# Patient Record
Sex: Male | Born: 1950 | Race: White | Hispanic: No | Marital: Single | Smoking: Current every day smoker
Health system: Southern US, Community
[De-identification: ages and names within clinical notes are randomized; demographics above are authoritative.]

## PROBLEM LIST (undated history)

## (undated) DIAGNOSIS — J449 Chronic obstructive pulmonary disease, unspecified: Secondary | ICD-10-CM

## (undated) DIAGNOSIS — M5136 Other intervertebral disc degeneration, lumbar region: Secondary | ICD-10-CM

## (undated) DIAGNOSIS — I639 Cerebral infarction, unspecified: Secondary | ICD-10-CM

## (undated) DIAGNOSIS — M51369 Other intervertebral disc degeneration, lumbar region without mention of lumbar back pain or lower extremity pain: Secondary | ICD-10-CM

## (undated) HISTORY — DX: Other intervertebral disc degeneration, lumbar region: M51.36

## (undated) HISTORY — DX: Other intervertebral disc degeneration, lumbar region without mention of lumbar back pain or lower extremity pain: M51.369

## (undated) HISTORY — DX: Chronic obstructive pulmonary disease, unspecified: J44.9

## (undated) HISTORY — DX: Cerebral infarction, unspecified: I63.9

---

## 2002-09-21 ENCOUNTER — Encounter: Payer: Self-pay | Admitting: Emergency Medicine

## 2002-09-21 ENCOUNTER — Inpatient Hospital Stay (HOSPITAL_COMMUNITY): Admission: EM | Admit: 2002-09-21 | Discharge: 2002-09-22 | Payer: Self-pay | Admitting: Emergency Medicine

## 2006-09-23 ENCOUNTER — Inpatient Hospital Stay (HOSPITAL_COMMUNITY): Admission: RE | Admit: 2006-09-23 | Discharge: 2006-09-25 | Payer: Self-pay | Admitting: Neurosurgery

## 2007-09-02 HISTORY — PX: BACK SURGERY: SHX140

## 2008-12-18 IMAGING — CR DG LUMBAR SPINE 1V
1 series · 1 of 1 positions shown · non-contrast
Comparison: none

CLINICAL DATA: 55-year-old with disk herniation.  
 LUMBAR SPINE ? 1 VIEW:
 Lateral lumbar spine film from the operating room.

[view not recorded]
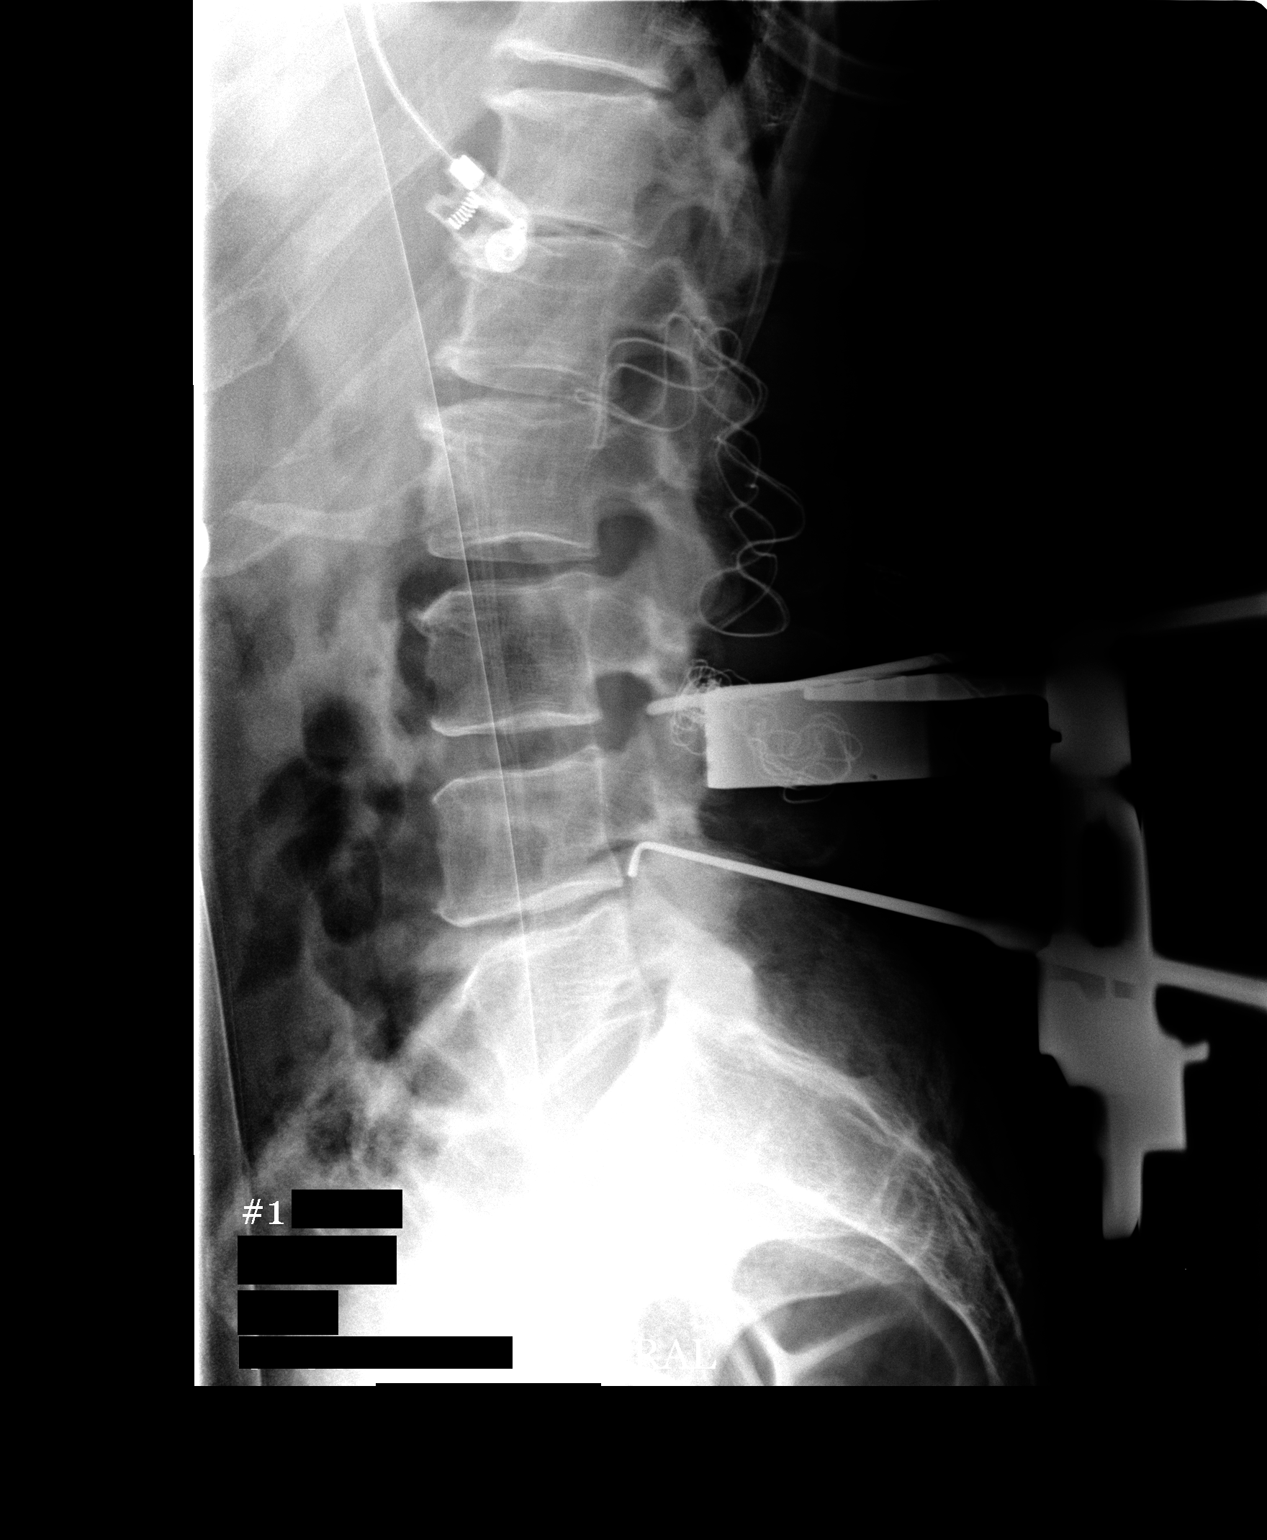

[1 of 1 positions shown; findings below may reference images not displayed]

FINDINGS: There are surgical instruments marking the L3-4 and L4-5 disk space.
IMPRESSION: L3-4 and L4-5 marked intraoperatively.

## 2010-09-22 ENCOUNTER — Encounter: Payer: Self-pay | Admitting: Orthopedic Surgery

## 2011-10-21 ENCOUNTER — Other Ambulatory Visit: Payer: Self-pay | Admitting: Orthopedic Surgery

## 2011-11-05 ENCOUNTER — Ambulatory Visit (HOSPITAL_BASED_OUTPATIENT_CLINIC_OR_DEPARTMENT_OTHER): Admission: RE | Admit: 2011-11-05 | Payer: 59 | Source: Ambulatory Visit | Admitting: Orthopedic Surgery

## 2011-11-05 ENCOUNTER — Encounter (HOSPITAL_BASED_OUTPATIENT_CLINIC_OR_DEPARTMENT_OTHER): Admission: RE | Payer: Self-pay | Source: Ambulatory Visit

## 2011-11-05 SURGERY — FASCIOTOMY, UPPER EXTREMITY
Anesthesia: Choice | Laterality: Left

## 2013-04-06 ENCOUNTER — Other Ambulatory Visit (HOSPITAL_COMMUNITY): Payer: Self-pay | Admitting: *Deleted

## 2013-04-06 DIAGNOSIS — R0602 Shortness of breath: Secondary | ICD-10-CM

## 2013-04-15 ENCOUNTER — Ambulatory Visit (HOSPITAL_COMMUNITY)
Admission: RE | Admit: 2013-04-15 | Discharge: 2013-04-15 | Disposition: A | Discharge status: Home / Self Care | Payer: 59 | Source: Ambulatory Visit | Attending: Family Medicine | Admitting: Family Medicine

## 2013-04-15 DIAGNOSIS — J988 Other specified respiratory disorders: Secondary | ICD-10-CM | POA: Insufficient documentation

## 2013-04-15 MED ORDER — ALBUTEROL SULFATE (5 MG/ML) 0.5% IN NEBU
2.5000 mg | INHALATION_SOLUTION | Freq: Once | RESPIRATORY_TRACT | Status: AC
  Administered 2013-04-15: 2.5 mg via RESPIRATORY_TRACT

## 2015-11-28 DIAGNOSIS — M25511 Pain in right shoulder: Secondary | ICD-10-CM | POA: Diagnosis not present

## 2015-11-28 DIAGNOSIS — Z1389 Encounter for screening for other disorder: Secondary | ICD-10-CM | POA: Diagnosis not present

## 2015-11-28 DIAGNOSIS — R55 Syncope and collapse: Secondary | ICD-10-CM | POA: Diagnosis not present

## 2015-11-28 DIAGNOSIS — Z6822 Body mass index (BMI) 22.0-22.9, adult: Secondary | ICD-10-CM | POA: Diagnosis not present

## 2015-11-28 DIAGNOSIS — Z720 Tobacco use: Secondary | ICD-10-CM | POA: Diagnosis not present

## 2015-11-28 DIAGNOSIS — R29898 Other symptoms and signs involving the musculoskeletal system: Secondary | ICD-10-CM | POA: Diagnosis not present

## 2015-11-28 DIAGNOSIS — Z9181 History of falling: Secondary | ICD-10-CM | POA: Diagnosis not present

## 2015-11-28 DIAGNOSIS — G4700 Insomnia, unspecified: Secondary | ICD-10-CM | POA: Diagnosis not present

## 2015-12-04 ENCOUNTER — Ambulatory Visit (INDEPENDENT_AMBULATORY_CARE_PROVIDER_SITE_OTHER): Payer: Medicare Other

## 2015-12-04 DIAGNOSIS — R55 Syncope and collapse: Secondary | ICD-10-CM | POA: Diagnosis not present

## 2015-12-05 ENCOUNTER — Telehealth: Payer: Self-pay | Admitting: *Deleted

## 2015-12-05 NOTE — Telephone Encounter (Signed)
Called and LVM again for pt per Dr Lucia GaskinsAhern request. Advised he will be seeing Dr Marjory LiesPenumalli tomorrow instead of Dr Lucia GaskinsAhern. Still at 1030am, check in 10am. Bring updated insurance card, meds. Gave [facility] phone number.

## 2015-12-05 NOTE — Telephone Encounter (Signed)
Tried calling pt. Dennis Mclean to see if he is willing to see Dr Marjory LiesPenumalli instead of Dr Lucia GaskinsAhern. Dr Lucia GaskinsAhern had some scheduling changes. Appt would be for same time just different provider. Check in 10am. Gave [facility] phone number for him to call back.

## 2015-12-06 ENCOUNTER — Ambulatory Visit (INDEPENDENT_AMBULATORY_CARE_PROVIDER_SITE_OTHER): Payer: Medicare Other | Admitting: Diagnostic Neuroimaging

## 2015-12-06 ENCOUNTER — Encounter: Payer: Self-pay | Admitting: Diagnostic Neuroimaging

## 2015-12-06 ENCOUNTER — Ambulatory Visit: Payer: Medicare Other | Admitting: Neurology

## 2015-12-06 VITALS — BP 129/66 | HR 74 | Ht 68.0 in | Wt 153.2 lb

## 2015-12-06 DIAGNOSIS — I63422 Cerebral infarction due to embolism of left anterior cerebral artery: Secondary | ICD-10-CM | POA: Diagnosis not present

## 2015-12-06 DIAGNOSIS — R292 Abnormal reflex: Secondary | ICD-10-CM

## 2015-12-06 DIAGNOSIS — R55 Syncope and collapse: Secondary | ICD-10-CM

## 2015-12-06 DIAGNOSIS — M25511 Pain in right shoulder: Secondary | ICD-10-CM

## 2015-12-06 DIAGNOSIS — R29898 Other symptoms and signs involving the musculoskeletal system: Secondary | ICD-10-CM

## 2015-12-06 DIAGNOSIS — R739 Hyperglycemia, unspecified: Secondary | ICD-10-CM | POA: Diagnosis not present

## 2015-12-06 MED ORDER — ASPIRIN EC 81 MG PO TBEC: 81.0000 mg | DELAYED_RELEASE_TABLET | Freq: Every day | ORAL | Status: DC

## 2015-12-06 NOTE — Progress Notes (Signed)
[facility]  PATIENT: Dennis Mclean DOB: Jun 10, 1951  REFERRING CLINICIAN: M Hamrick HISTORY FROM: patient  REASON FOR VISIT: new consult    HISTORICAL  CHIEF COMPLAINT:  Chief Complaint  Patient presents with  . Right shoulder pain/weakness of RLE    rm 7, New Pt, "r shoulder pain since June 2016, heard snap while unhooking trailer;  weakness RLE since Nov 2016"    HISTORY OF PRESENT ILLNESS:   65 year old right-handed male here for evaluation of syncope, right shoulder pain, right leg weakness.  June 2016 patient was on his trailer from his truck, pulled hard with his right hand, felt a popping sensation in the right shoulder and had severe pain and weakness in his right arm. Symptoms continued for several months and has slightly improved. Patient still has significant decreased range of motion in his right shoulder and weakness in his right biceps. No numbness or tingling in his right arm.  Around 07/17/2015 patient was at home sitting down, when all of a sudden he felt lightheaded, cold, rapid heart rate. He felt like something bad was happening. A few moments later he woke up on the floor and apparently had passed out. He did not seek medical attention for this event. After this event he noticed that his right leg had numbness and weakness and he was "dragging it". This is continued until the present time. He did not seek medical attention for this at the time.  Patient's daughter was visiting with him recently, found out about these events and urged him to seek medical attention. Patient went to PCP who then referred patient to me for further evaluation.  In retrospect patient has had some intermittent lightheaded events over the past few months. He had his last episode occurred in January 2017.  Patient notes some significant snoring as reported by his significant other. He also has some anxiety problems. Patient smokes less than one pack cigarettes per  day. He drinks 3 beers per day. He drinks 2 cups of coffee per day.   REVIEW OF SYSTEMS: Full 14 system review of systems performed and negative with exception of: As per history of present illness.  ALLERGIES: No Known Allergies  HOME MEDICATIONS: No outpatient prescriptions prior to visit.   No facility-administered medications prior to visit.    PAST MEDICAL HISTORY: Past Medical History  Diagnosis Date  . DDD (degenerative disc disease), lumbar     PAST SURGICAL HISTORY: Past Surgical History  Procedure Laterality Date  . Back surgery  2009    lower    FAMILY HISTORY: Family History  Problem Relation Age of Onset  . Stroke Mother   . Heart disease Father     SOCIAL HISTORY:  Social History   Social History  . Marital Status: Single    Spouse Name: N/A  . Number of Children: 3  . Years of Education: 13   Occupational History  .      retired   Social History Main Topics  . Smoking status: Current Every Day Smoker -- 0.50 packs/day  . Smokeless tobacco: Not on file     Comment: 12/06/15 < 1 PPD  . Alcohol Use: Yes     Comment: 3 beers a day  . 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 Sexual Activity: Not on file   Other Topics Concern  . Not on file   Social History Narrative   Lives alone   Caffeine-  coffee 2 cups daily     PHYSICAL EXAM  GENERAL EXAM/CONSTITUTIONAL: Vitals:  Filed Vitals:   12/06/15 1010  BP: 129/66  Pulse: 74  Height: 5\' 8"  (1.727 m)  Weight: 153 lb 3.2 oz (69.491 kg)     Body mass index is 23.3 kg/(m^2).  Visual Acuity Screening   Right eye Left eye Both eyes  Without correction: 20/30 20/30   With correction:        Patient is in no distress; well developed, nourished and groomed; neck is supple  CARDIOVASCULAR:  Examination of carotid arteries is normal; no carotid bruits  Regular rate and rhythm, no murmurs  Examination of peripheral vascular system by observation and palpation is  normal  EYES:  Ophthalmoscopic exam of optic discs and posterior segments is normal; no papilledema or hemorrhages  MUSCULOSKELETAL:  Gait, strength, tone, movements noted in Neurologic exam below  NEUROLOGIC: MENTAL STATUS:  No flowsheet data found.  awake, alert, oriented to person, place and time  recent and remote memory intact  normal attention and concentration  language fluent, comprehension intact, naming intact,   fund of knowledge appropriate  CRANIAL NERVE:   2nd - no papilledema on fundoscopic exam  2nd, 3rd, 4th, 6th - pupils equal and reactive to light, visual fields full to confrontation, extraocular muscles intact, no nystagmus  5th - facial sensation symmetric  7th - facial strength symmetric  8th - hearing intact  9th - palate elevates symmetrically, uvula midline  11th - shoulder shrug symmetric  12th - tongue protrusion midline  MOTOR:   normal bulk and tone, full strength in the LUE, LLE  RUE LIMITED ROM AT SHOULDER (PASSIVE AND ACTIVE) WITH CREPITUS IN RIGHT SHOULDER; RIGHT DELTOID 2-3; REMAINDER OF RUE 5  RLE --> HF 4, KE/KF 4, DF 4; INCREASED TONE IN RLE  SENSORY:   normal and symmetric to light touch, pinprick, temperature, vibration  COORDINATION:   finger-nose-finger, fine finger movements normal; LIMITED IN RUE DUE TO SHOULDER PAIN  HEEL SHIN ON RIGHT HAS DYSMETRIA  REFLEXES:   deep tendon reflexes --> BUE 2; RIGHT KNEE 3, RIGHT ANKLE 1; LEFT KNEE 2, LEFT ANKLE TRACE  GAIT/STATION:   RIGHT LEG SPASTIC GAIT; SCUFFS SHOE ON GROUND; SOMETIMES HIGH STEPPING    DIAGNOSTIC DATA (LABS, IMAGING, TESTING) - I reviewed patient records, labs, notes, testing and imaging myself where available.  No results found for: WBC, HGB, HCT, MCV, PLT No results found for: NA, K, CL, CO2, GLUCOSE, BUN, CREATININE, CALCIUM, PROT, ALBUMIN, AST, ALT, ALKPHOS, BILITOT, GFRNONAA, GFRAA No results found for: CHOL, HDL, LDLCALC, LDLDIRECT, TRIG,  CHOLHDL No results found for: ZHYQ6VHGBA1C No results found for: VITAMINB12 No results found for: TSH  09/24/06 XRAY LUMBAR  L3-4 and L4-5 marked intraoperatively.    ASSESSMENT AND PLAN  65 y.o. year old male here with new onset episode of syncope in November 2016, followed by right leg weakness and upper motor neuron signs since that time. Could represent syncope and stroke. Patient also has right arm weakness likely related to right shoulder injury in June 2016, which is likely a separate problem.   PROBLEM LIST:  1. Syncope and collapse   2. Right shoulder pain   3. Right leg weakness   4. Hyperreflexia of lower extremity   5. Cerebrovascular accident (CVA) due to embolism of left anterior cerebral artery (HCC)   6. Hyperglycemia      PLAN:  STROKE WORKUP (syncope and right leg weakness) - MRI, MRA head,  TTE, carotid, lipids, A1c, sleep study - start aspirin  daily - smoking cessation reviewed - limit ETOH to 1 drink per day - nutrition and exercise reviewed  SYNCOPE WORKUP - EEG for seizure workup - follow up cardiac monitor - follow up with cardiology for syncope workup  RIGHT SHOULDER PAIN - consider orthopedic consult for rotator cuff tear / shoulder injury (from June 2016)   Orders Placed This Encounter  Procedures  . MR Brain Wo Contrast  . MR MRA HEAD WO CONTRAST  . Lipid Panel  . CBC with Differential/Platelet  . Comprehensive metabolic panel  . Hemoglobin A1c  . Ambulatory referral to Sleep Studies  . ECHOCARDIOGRAM COMPLETE  . EEG adult   Meds ordered this encounter  Medications  . aspirin EC 81 MG tablet    Sig: Take 1 tablet (81 mg total) by mouth daily.   Return in about 6 weeks (around 01/17/2016).  I reviewed images, labs, notes, records myself. I summarized findings and reviewed with patient, for this high risk condition (syncope; possible stroke) requiring high complexity decision making.     Suanne Marker, MD 12/06/2015, 11:37  AM Certified in Neurology, Neurophysiology and Neuroimaging  [facility] [address] [place], [place] [address] [phone]

## 2015-12-06 NOTE — Patient Instructions (Signed)
  Thank you for coming to see [place] at [facility]. I hope we have been able to provide you high quality care today.  You may receive a patient satisfaction survey over the next few weeks. We would appreciate your feedback and comments so that we may continue to improve ourselves and the health of our patients.   STROKE WORKUP (passsing out and right leg weakness) - MRI, MRA head, ultrasound of heart, ultrasound of carotids, labs, sleep study - start aspirin 61m daily - cut down / quit smoking - cut down / quit beer intake (maximum 1 drink per day) - try to improve nutrition and exercise  SYNCOPE / PASSING OUT WORKUP - EEG for seizure workup - follow up cardiac monitor test - follow up with cardiology clinic   RIGHT SHOULDER PAIN - consider orthopedic consult for rotator cuff tear / shoulder injury (from June 2016)    ~~~~~~~~~~~~~~~~~~~~~~~~~~~~~~~~~~~~~~~~~~~~~~~~~~~~~~~~~~~~~~~~~  DR. Gurveer Colucci'S GUIDE TO HAPPY AND HEALTHY LIVING These are some of my general health and wellness recommendations. Some of them may apply to you better than others. Please use common sense as you try these suggestions and feel free to ask me any questions.   ACTIVITY/FITNESS Mental, social, emotional and physical stimulation are very important for brain and body health. Try learning a new activity (arts, music, language, sports, games).  Keep moving your body to the best of your abilities. You can do this at home, inside or outside, the park, community center, gym or anywhere you like. Consider a physical therapist or personal trainer to get started. Consider the app Sworkit. Fitness trackers such as smart-watches, smart-phones or Fitbits can help as well.   NUTRITION Eat more plants: colorful vegetables, nuts, seeds and berries.  Eat less sugar, salt, preservatives and processed foods.  Avoid toxins such as cigarettes and alcohol.  Drink water when you are thirsty. Warm water  with a slice of lemon is an excellent morning drink to start the day.  Consider these websites for more information The Nutrition Source (hhttps://www.henry-hernandez.biz/ [employer] Nutrition (wWindowBlog.ch   RELAXATION Consider practicing mindfulness meditation or other relaxation techniques such as deep breathing, prayer, yoga, tai chi, massage. See website mindful.org or the apps Headspace or Calm to help get started.   SLEEP Try to get at least 7-8+ hours sleep per day. Regular exercise and reduced caffeine will help you sleep better. Practice good sleep hygeine techniques. See website sleep.org for more information.   PLANNING Prepare estate planning, living will, healthcare POA documents. Sometimes this is best planned with the help of an attorney. Theconversationproject.org and agingwithdignity.org are excellent resources.

## 2015-12-07 ENCOUNTER — Telehealth: Payer: Self-pay | Admitting: *Deleted

## 2015-12-07 LAB — LIPID PANEL
CHOLESTEROL TOTAL: 186 mg/dL (ref 100–199)
Chol/HDL Ratio: 4.5 ratio units (ref 0.0–5.0)
HDL: 41 mg/dL (ref >39)
LDL Calculated: 108 mg/dL — ABNORMAL HIGH (ref 0–99)
TRIGLYCERIDES: 184 mg/dL — AB (ref 0–149)
VLDL Cholesterol Cal: 37 mg/dL (ref 5–40)

## 2015-12-07 LAB — CBC WITH DIFFERENTIAL/PLATELET
BASOS ABS: 0.1 10*3/uL (ref 0.0–0.2)
BASOS: 1 %
EOS (ABSOLUTE): 0.2 10*3/uL (ref 0.0–0.4)
Eos: 2 %
Hematocrit: 49.9 % (ref 37.5–51.0)
Hemoglobin: 17.3 g/dL (ref 12.6–17.7)
IMMATURE GRANS (ABS): 0 10*3/uL (ref 0.0–0.1)
Immature Granulocytes: 0 %
LYMPHS: 37 %
Lymphocytes Absolute: 3.2 10*3/uL — ABNORMAL HIGH (ref 0.7–3.1)
MCH: 33.3 pg — AB (ref 26.6–33.0)
MCHC: 34.7 g/dL (ref 31.5–35.7)
MCV: 96 fL (ref 79–97)
MONOCYTES: 10 %
Monocytes Absolute: 0.8 10*3/uL (ref 0.1–0.9)
NEUTROS ABS: 4.4 10*3/uL (ref 1.4–7.0)
Neutrophils: 50 %
Platelets: 223 10*3/uL (ref 150–379)
RBC: 5.19 x10E6/uL (ref 4.14–5.80)
RDW: 13.6 % (ref 12.3–15.4)
WBC: 8.7 10*3/uL (ref 3.4–10.8)

## 2015-12-07 LAB — HEMOGLOBIN A1C
Est. average glucose Bld gHb Est-mCnc: 103 mg/dL
Hgb A1c MFr Bld: 5.2 % (ref 4.8–5.6)

## 2015-12-07 LAB — COMPREHENSIVE METABOLIC PANEL
ALBUMIN: 4.6 g/dL (ref 3.6–4.8)
ALT: 17 IU/L (ref 0–44)
AST: 22 IU/L (ref 0–40)
Albumin/Globulin Ratio: 1.8 (ref 1.2–2.2)
Alkaline Phosphatase: 83 IU/L (ref 39–117)
BILIRUBIN TOTAL: 0.5 mg/dL (ref 0.0–1.2)
BUN / CREAT RATIO: 13 (ref 10–24)
BUN: 12 mg/dL (ref 8–27)
CO2: 25 mmol/L (ref 18–29)
CREATININE: 0.9 mg/dL (ref 0.76–1.27)
Calcium: 10.1 mg/dL (ref 8.6–10.2)
Chloride: 100 mmol/L (ref 96–106)
GFR, EST AFRICAN AMERICAN: 103 mL/min/{1.73_m2} (ref >59)
GFR, EST NON AFRICAN AMERICAN: 89 mL/min/{1.73_m2} (ref >59)
GLUCOSE: 81 mg/dL (ref 65–99)
Globulin, Total: 2.6 g/dL (ref 1.5–4.5)
Potassium: 5.8 mmol/L — ABNORMAL HIGH (ref 3.5–5.2)
Sodium: 141 mmol/L (ref 134–144)
TOTAL PROTEIN: 7.2 g/dL (ref 6.0–8.5)

## 2015-12-07 NOTE — Telephone Encounter (Signed)
Per Dr Marjory LiesPenumalli, spoke with patient and informed him his lab results are back. All results normal except elevated triglycerides and LDL which Dr Marjory LiesPenumalli recommends he change his diet, have PCP monitor. Discussed diet changes with pt. Informed him his potassium is elevated, and dr wants him to come into office in a week to repeat. Gave him lab hours of operation. He verbalized understanding, appreciation.

## 2015-12-10 ENCOUNTER — Telehealth: Payer: Self-pay | Admitting: *Deleted

## 2015-12-10 NOTE — Telephone Encounter (Signed)
Patient called back to advise, will do labs when he comes in tomorrow 12/11/15 for EEG.

## 2015-12-10 NOTE — Telephone Encounter (Signed)
Spoke with patient re: repeat lab today per Dr Marjory LiesPenumalli, unless he is feeling well. He stated he is feeling fine, no new problems, c/o. Advised he come in for repeat lab by Wed this week. He [facility] and will come in that day. He had no questions., verbalized understanding.

## 2015-12-11 ENCOUNTER — Ambulatory Visit (INDEPENDENT_AMBULATORY_CARE_PROVIDER_SITE_OTHER): Payer: Medicare Other | Admitting: Diagnostic Neuroimaging

## 2015-12-11 ENCOUNTER — Other Ambulatory Visit (INDEPENDENT_AMBULATORY_CARE_PROVIDER_SITE_OTHER): Payer: Self-pay

## 2015-12-11 ENCOUNTER — Other Ambulatory Visit: Payer: Self-pay | Admitting: *Deleted

## 2015-12-11 DIAGNOSIS — R29898 Other symptoms and signs involving the musculoskeletal system: Secondary | ICD-10-CM | POA: Diagnosis not present

## 2015-12-11 DIAGNOSIS — R292 Abnormal reflex: Secondary | ICD-10-CM

## 2015-12-11 DIAGNOSIS — R55 Syncope and collapse: Secondary | ICD-10-CM

## 2015-12-11 DIAGNOSIS — I63422 Cerebral infarction due to embolism of left anterior cerebral artery: Secondary | ICD-10-CM

## 2015-12-11 DIAGNOSIS — Z0289 Encounter for other administrative examinations: Secondary | ICD-10-CM

## 2015-12-12 ENCOUNTER — Telehealth: Payer: Self-pay | Admitting: *Deleted

## 2015-12-12 ENCOUNTER — Ambulatory Visit (HOSPITAL_COMMUNITY)
Admission: RE | Admit: 2015-12-12 | Discharge: 2015-12-12 | Disposition: A | Discharge status: Home / Self Care | Payer: Medicare Other | Source: Ambulatory Visit | Attending: Diagnostic Neuroimaging | Admitting: Diagnostic Neuroimaging

## 2015-12-12 DIAGNOSIS — R29898 Other symptoms and signs involving the musculoskeletal system: Secondary | ICD-10-CM | POA: Insufficient documentation

## 2015-12-12 DIAGNOSIS — R55 Syncope and collapse: Secondary | ICD-10-CM | POA: Diagnosis not present

## 2015-12-12 DIAGNOSIS — R292 Abnormal reflex: Secondary | ICD-10-CM | POA: Insufficient documentation

## 2015-12-12 DIAGNOSIS — I63422 Cerebral infarction due to embolism of left anterior cerebral artery: Secondary | ICD-10-CM | POA: Diagnosis not present

## 2015-12-12 LAB — COMPREHENSIVE METABOLIC PANEL
ALT: 15 IU/L (ref 0–44)
AST: 21 IU/L (ref 0–40)
Albumin/Globulin Ratio: 1.5 (ref 1.2–2.2)
Albumin: 4.3 g/dL (ref 3.6–4.8)
Alkaline Phosphatase: 83 IU/L (ref 39–117)
BUN/Creatinine Ratio: 12 (ref 10–24)
BUN: 13 mg/dL (ref 8–27)
Bilirubin Total: 0.5 mg/dL (ref 0.0–1.2)
CALCIUM: 9.4 mg/dL (ref 8.6–10.2)
CHLORIDE: 100 mmol/L (ref 96–106)
CO2: 23 mmol/L (ref 18–29)
Creatinine, Ser: 1.07 mg/dL (ref 0.76–1.27)
GFR, EST AFRICAN AMERICAN: 84 mL/min/{1.73_m2} (ref >59)
GFR, EST NON AFRICAN AMERICAN: 72 mL/min/{1.73_m2} (ref >59)
GLUCOSE: 114 mg/dL — AB (ref 65–99)
Globulin, Total: 2.8 g/dL (ref 1.5–4.5)
Potassium: 4.8 mmol/L (ref 3.5–5.2)
Sodium: 138 mmol/L (ref 134–144)
TOTAL PROTEIN: 7.1 g/dL (ref 6.0–8.5)

## 2015-12-12 NOTE — Progress Notes (Signed)
VASCULAR LAB PRELIMINARY  PRELIMINARY  PRELIMINARY  PRELIMINARY  Carotid duplex completed.    Right side: 1-39% ICA stenosis. No evidence stenosis in left ICA. Bilaterally vertebral artery - antegrade. Patient said he fell at the emergency entrance. Patient had injuried left hand  knuckles scrapes and left knee scrapes.  Jenetta Logesami Adrik Khim, RVT, RDMS 12/12/2015, 12:53 PM

## 2015-12-12 NOTE — Telephone Encounter (Signed)
LVM for patient informing him his repeat lab results are normal. Left number for any questions.

## 2015-12-14 NOTE — Procedures (Signed)
   [facility]  EEG (ELECTROENCEPHALOGRAM) REPORT   STUDY DATE: 12/11/15 PATIENT NAME: Dennis Mclean DOB: 12/23/1950 MRN: [id number]  ORDERING CLINICIAN: Joycelyn SchmidVikram Jeffrie Lofstrom, MD   TECHNOLOGIST: Gearldine ShownLorraine Jones  TECHNIQUE: Electroencephalogram was recorded utilizing standard 10-20 system of lead placement and reformatted into average and bipolar montages.  RECORDING TIME: 27 minutes  ACTIVATION: hyperventilation and photic stimulation  CLINICAL INFORMATION: 65 year old male with lightheadedness and syncope.  FINDINGS: Background rhythms of 12-14 hertz and 15-20 microvolts. No focal, lateralizing, epileptiform activity or seizures are seen. Patient recorded in the awake and drowsy state. EKG channel shows 60-70 beats per minute.  IMPRESSION:  Normal EEG in the awake state.    INTERPRETING PHYSICIAN:  Suanne MarkerVIKRAM R. Talvin Christianson, MD Certified in Neurology, Neurophysiology and Neuroimaging  [facility] [address], [address] [place], [place] [address] [phone]

## 2015-12-17 ENCOUNTER — Ambulatory Visit
Admission: RE | Admit: 2015-12-17 | Discharge: 2015-12-17 | Disposition: A | Discharge status: Home / Self Care | Payer: Medicare Other | Source: Ambulatory Visit | Attending: Diagnostic Neuroimaging | Admitting: Diagnostic Neuroimaging

## 2015-12-17 ENCOUNTER — Ambulatory Visit (INDEPENDENT_AMBULATORY_CARE_PROVIDER_SITE_OTHER): Payer: Medicare Other | Admitting: Neurology

## 2015-12-17 ENCOUNTER — Encounter: Payer: Self-pay | Admitting: Neurology

## 2015-12-17 VITALS — BP 130/82 | HR 78 | Resp 20 | Ht 68.0 in | Wt 144.0 lb

## 2015-12-17 DIAGNOSIS — F5102 Adjustment insomnia: Secondary | ICD-10-CM | POA: Diagnosis not present

## 2015-12-17 DIAGNOSIS — R55 Syncope and collapse: Secondary | ICD-10-CM

## 2015-12-17 DIAGNOSIS — R29898 Other symptoms and signs involving the musculoskeletal system: Secondary | ICD-10-CM | POA: Diagnosis not present

## 2015-12-17 DIAGNOSIS — G451 Carotid artery syndrome (hemispheric): Secondary | ICD-10-CM | POA: Diagnosis not present

## 2015-12-17 DIAGNOSIS — F17200 Nicotine dependence, unspecified, uncomplicated: Secondary | ICD-10-CM | POA: Diagnosis not present

## 2015-12-17 DIAGNOSIS — R292 Abnormal reflex: Secondary | ICD-10-CM

## 2015-12-17 DIAGNOSIS — G4726 Circadian rhythm sleep disorder, shift work type: Secondary | ICD-10-CM | POA: Diagnosis not present

## 2015-12-17 DIAGNOSIS — R0683 Snoring: Secondary | ICD-10-CM | POA: Diagnosis not present

## 2015-12-17 DIAGNOSIS — I63422 Cerebral infarction due to embolism of left anterior cerebral artery: Secondary | ICD-10-CM
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
## 2015-12-17 NOTE — Patient Instructions (Addendum)
Please remember to try to maintain good sleep hygiene, which means: Keep a regular sleep and wake schedule, try not to exercise or have a meal within 2 hours of your bedtime, try to keep your bedroom conducive for sleep, that is, cool and dark, without light distractors such as an illuminated alarm clock, and refrain from watching TV right before sleep or in the middle of the night and do not keep the TV or radio on during the night. Also, try not to use or play on electronic devices at bedtime, such as your cell phone, tablet PC or laptop. If you like to read at bedtime on an electronic device, try to dim the background light as much as possible. Do not eat in the middle of the night.   We will request a sleep study.    We will look for leg twitching and snoring or sleep apnea.   For chronic insomnia, you are best followed by a psychiatrist and/or sleep psychologist.   We will call you with the sleep study results and make a follow up appointment if needed.    Smoking Cessation, Tips for Success If you are ready to quit smoking, congratulations! You have chosen to help yourself be healthier. Cigarettes bring nicotine, tar, carbon monoxide, and other irritants into your body. Your lungs, heart, and blood vessels will be able to work better without these poisons. There are many different ways to quit smoking. Nicotine gum, nicotine patches, a nicotine inhaler, or nicotine nasal spray can help with physical craving. Hypnosis, support groups, and medicines help break the habit of smoking. WHAT THINGS CAN I DO TO MAKE QUITTING EASIER?  Here are some tips to help you quit for good:  Pick a date when you will quit smoking completely. Tell all of your friends and family about your plan to quit on that date.  Do not try to slowly cut down on the number of cigarettes you are smoking. Pick a quit date and quit smoking completely starting on that day.  Throw away all cigarettes.   Clean and remove all  ashtrays from your home, work, and car.  On a card, write down your reasons for quitting. Carry the card with you and read it when you get the urge to smoke.  Cleanse your body of nicotine. Drink enough water and fluids to keep your urine clear or pale yellow. Do this after quitting to flush the nicotine from your body.  Learn to predict your moods. Do not let a bad situation be your excuse to have a cigarette. Some situations in your life might tempt you into wanting a cigarette.  Never have "just one" cigarette. It leads to wanting another and another. Remind yourself of your decision to quit.  Change habits associated with smoking. If you smoked while driving or when feeling stressed, try other activities to replace smoking. Stand up when drinking your coffee. Brush your teeth after eating. Sit in a different chair when you read the paper. Avoid alcohol while trying to quit, and try to drink fewer caffeinated beverages. Alcohol and caffeine may urge you to smoke.  Avoid foods and drinks that can trigger a desire to smoke, such as sugary or spicy foods and alcohol.  Ask people who smoke not to smoke around you.  Have something planned to do right after eating or having a cup of coffee. For example, plan to take a walk or exercise.  Try a relaxation exercise to calm you down and decrease  your stress. Remember, you may be tense and nervous for the first 2 weeks after you quit, but this will pass.  Find new activities to keep your hands busy. Play with a pen, coin, or rubber band. Doodle or draw things on paper.  Brush your teeth right after eating. This will help cut down on the craving for the taste of tobacco after meals. You can also try mouthwash.   Use oral substitutes in place of cigarettes. Try using lemon drops, carrots, cinnamon sticks, or chewing gum. Keep them handy so they are available when you have the urge to smoke.  When you have the urge to smoke, try deep  breathing.  Designate your home as a nonsmoking area.  If you are a heavy smoker, ask your health care provider about a prescription for nicotine chewing gum. It can ease your withdrawal from nicotine.  Reward yourself. Set aside the cigarette money you save and buy yourself something nice.  Look for support from others. Join a support group or smoking cessation program. Ask someone at home or at work to help you with your plan to quit smoking.  Always ask yourself, "Do I need this cigarette or is this just a reflex?" Tell yourself, "Today, I choose not to smoke," or "I do not want to smoke." You are reminding yourself of your decision to quit.  Do not replace cigarette smoking with electronic cigarettes (commonly called e-cigarettes). The safety of e-cigarettes is unknown, and some may contain harmful chemicals.  If you relapse, do not give up! Plan ahead and think about what you will do the next time you get the urge to smoke. HOW WILL I FEEL WHEN I QUIT SMOKING? You may have symptoms of withdrawal because your body is used to nicotine (the addictive substance in cigarettes). You may crave cigarettes, be irritable, feel very hungry, cough often, get headaches, or have difficulty concentrating. The withdrawal symptoms are only temporary. They are strongest when you first quit but will go away within 10-14 days. When withdrawal symptoms occur, stay in control. Think about your reasons for quitting. Remind yourself that these are signs that your body is healing and getting used to being without cigarettes. Remember that withdrawal symptoms are easier to treat than the major diseases that smoking can cause.  Even after the withdrawal is over, expect periodic urges to smoke. However, these cravings are generally short lived and will go away whether you smoke or not. Do not smoke! WHAT RESOURCES ARE AVAILABLE TO HELP ME QUIT SMOKING? Your health care provider can direct you to community resources or  hospitals for support, which may include:  Group support.  Education.  Hypnosis.  Therapy.   This information is not intended to replace advice given to you by your health care provider. Make sure you discuss any questions you have with your health care provider.   Document Released: 05/16/2004 Document Revised: 09/08/2014 Document Reviewed: 02/03/2013 Elsevier Interactive Patient Education [employer]

## 2015-12-26 ENCOUNTER — Telehealth: Payer: Self-pay | Admitting: Diagnostic Neuroimaging

## 2015-12-26 NOTE — Telephone Encounter (Signed)
Patient called to request results of MRI, EEG, Ultrasound.

## 2015-12-28 NOTE — Telephone Encounter (Signed)
I called patient with results. Continue stroke risk factor reduction. Has echo on Monday. No clear explanation for right leg weakness at this time. Could be lumbar radiculopathy vs c or t spine cord issue. Left ICA in brain not seen (? Congenital absent vs occlusion). Bilateral ICA in neck seems ok by u/s.   PLAN: - follow up TTE on Monday - follow up sleep study - follow up in clinic with me later in May 2017.   Suanne MarkerVIKRAM R. PENUMALLI, MD 12/28/2015, 6:15 PM Certified in Neurology, Neurophysiology and Neuroimaging  [facility] [address], [address] [place], [place] [id number] [phone]

## 2015-12-31 ENCOUNTER — Ambulatory Visit (HOSPITAL_COMMUNITY): Payer: Medicare Other | Attending: Cardiovascular Disease

## 2015-12-31 ENCOUNTER — Other Ambulatory Visit: Payer: Self-pay

## 2015-12-31 DIAGNOSIS — I639 Cerebral infarction, unspecified: Secondary | ICD-10-CM | POA: Diagnosis present

## 2015-12-31 DIAGNOSIS — R292 Abnormal reflex: Secondary | ICD-10-CM | POA: Insufficient documentation

## 2015-12-31 DIAGNOSIS — Z720 Tobacco use: Secondary | ICD-10-CM | POA: Insufficient documentation

## 2015-12-31 DIAGNOSIS — R29898 Other symptoms and signs involving the musculoskeletal system: Secondary | ICD-10-CM | POA: Diagnosis not present

## 2015-12-31 DIAGNOSIS — R55 Syncope and collapse: Secondary | ICD-10-CM | POA: Diagnosis not present

## 2015-12-31 DIAGNOSIS — I63422 Cerebral infarction due to embolism of left anterior cerebral artery: Secondary | ICD-10-CM | POA: Diagnosis not present

## 2016-01-24 ENCOUNTER — Ambulatory Visit: Payer: Medicare Other | Admitting: Diagnostic Neuroimaging

## 2016-01-25 ENCOUNTER — Encounter: Payer: Self-pay | Admitting: Diagnostic Neuroimaging

## 2016-12-08 ENCOUNTER — Telehealth: Payer: Self-pay

## 2016-12-08 NOTE — Telephone Encounter (Signed)
Called patient to schedule sleep study 3 times. Have not heard back.  

## 2016-12-09 DIAGNOSIS — G2581 Restless legs syndrome: Secondary | ICD-10-CM | POA: Diagnosis not present

## 2016-12-09 DIAGNOSIS — Z6823 Body mass index (BMI) 23.0-23.9, adult: Secondary | ICD-10-CM | POA: Diagnosis not present

## 2016-12-09 DIAGNOSIS — G4700 Insomnia, unspecified: Secondary | ICD-10-CM | POA: Diagnosis not present

## 2017-03-11 DIAGNOSIS — Z1389 Encounter for screening for other disorder: Secondary | ICD-10-CM | POA: Diagnosis not present

## 2017-03-11 DIAGNOSIS — Z1211 Encounter for screening for malignant neoplasm of colon: Secondary | ICD-10-CM | POA: Diagnosis not present

## 2017-03-11 DIAGNOSIS — Z125 Encounter for screening for malignant neoplasm of prostate: Secondary | ICD-10-CM | POA: Diagnosis not present

## 2017-03-11 DIAGNOSIS — Z9181 History of falling: Secondary | ICD-10-CM | POA: Diagnosis not present

## 2017-03-11 DIAGNOSIS — Z0000 Encounter for general adult medical examination without abnormal findings: Secondary | ICD-10-CM | POA: Diagnosis not present

## 2017-03-17 DIAGNOSIS — Z125 Encounter for screening for malignant neoplasm of prostate: Secondary | ICD-10-CM | POA: Diagnosis not present

## 2017-03-17 DIAGNOSIS — Z136 Encounter for screening for cardiovascular disorders: Secondary | ICD-10-CM | POA: Diagnosis not present

## 2017-03-17 DIAGNOSIS — Z131 Encounter for screening for diabetes mellitus: Secondary | ICD-10-CM | POA: Diagnosis not present

## 2017-03-26 DIAGNOSIS — Z1211 Encounter for screening for malignant neoplasm of colon: Secondary | ICD-10-CM | POA: Diagnosis not present

## 2017-06-17 DIAGNOSIS — Z6822 Body mass index (BMI) 22.0-22.9, adult: Secondary | ICD-10-CM | POA: Diagnosis not present

## 2017-06-17 DIAGNOSIS — F410 Panic disorder [episodic paroxysmal anxiety] without agoraphobia: Secondary | ICD-10-CM | POA: Diagnosis not present

## 2017-06-17 DIAGNOSIS — G4700 Insomnia, unspecified: Secondary | ICD-10-CM | POA: Diagnosis not present

## 2018-02-16 DIAGNOSIS — Z136 Encounter for screening for cardiovascular disorders: Secondary | ICD-10-CM | POA: Diagnosis not present

## 2018-02-16 DIAGNOSIS — G4700 Insomnia, unspecified: Secondary | ICD-10-CM | POA: Diagnosis not present

## 2018-02-16 DIAGNOSIS — F410 Panic disorder [episodic paroxysmal anxiety] without agoraphobia: Secondary | ICD-10-CM | POA: Diagnosis not present

## 2018-02-16 DIAGNOSIS — Z6823 Body mass index (BMI) 23.0-23.9, adult: Secondary | ICD-10-CM | POA: Diagnosis not present

## 2018-02-16 DIAGNOSIS — G2581 Restless legs syndrome: Secondary | ICD-10-CM | POA: Diagnosis not present

## 2018-06-01 DIAGNOSIS — Z6823 Body mass index (BMI) 23.0-23.9, adult: Secondary | ICD-10-CM | POA: Diagnosis not present

## 2018-06-01 DIAGNOSIS — Z720 Tobacco use: Secondary | ICD-10-CM | POA: Diagnosis not present

## 2018-06-01 DIAGNOSIS — J440 Chronic obstructive pulmonary disease with acute lower respiratory infection: Secondary | ICD-10-CM | POA: Diagnosis not present

## 2018-06-11 DIAGNOSIS — Z720 Tobacco use: Secondary | ICD-10-CM | POA: Diagnosis not present

## 2018-06-11 DIAGNOSIS — Z6823 Body mass index (BMI) 23.0-23.9, adult: Secondary | ICD-10-CM | POA: Diagnosis not present

## 2018-06-11 DIAGNOSIS — J441 Chronic obstructive pulmonary disease with (acute) exacerbation: Secondary | ICD-10-CM | POA: Diagnosis not present

## 2018-06-11 DIAGNOSIS — Z87891 Personal history of nicotine dependence: Secondary | ICD-10-CM | POA: Diagnosis not present

## 2018-06-11 DIAGNOSIS — B370 Candidal stomatitis: Secondary | ICD-10-CM | POA: Diagnosis not present

## 2018-06-11 DIAGNOSIS — R05 Cough: Secondary | ICD-10-CM | POA: Diagnosis not present

## 2018-06-11 DIAGNOSIS — R0602 Shortness of breath: Secondary | ICD-10-CM | POA: Diagnosis not present

## 2018-07-16 ENCOUNTER — Other Ambulatory Visit: Payer: Self-pay

## 2018-08-18 DIAGNOSIS — M5441 Lumbago with sciatica, right side: Secondary | ICD-10-CM | POA: Diagnosis not present

## 2018-08-18 DIAGNOSIS — Z6824 Body mass index (BMI) 24.0-24.9, adult: Secondary | ICD-10-CM | POA: Diagnosis not present

## 2018-08-27 DIAGNOSIS — M5441 Lumbago with sciatica, right side: Secondary | ICD-10-CM | POA: Diagnosis not present

## 2018-08-27 DIAGNOSIS — Z1331 Encounter for screening for depression: Secondary | ICD-10-CM | POA: Diagnosis not present

## 2018-08-27 DIAGNOSIS — Z9181 History of falling: Secondary | ICD-10-CM | POA: Diagnosis not present

## 2018-08-27 DIAGNOSIS — I69959 Hemiplegia and hemiparesis following unspecified cerebrovascular disease affecting unspecified side: Secondary | ICD-10-CM | POA: Diagnosis not present

## 2018-08-27 DIAGNOSIS — Z6823 Body mass index (BMI) 23.0-23.9, adult: Secondary | ICD-10-CM | POA: Diagnosis not present

## 2018-09-11 DIAGNOSIS — M47817 Spondylosis without myelopathy or radiculopathy, lumbosacral region: Secondary | ICD-10-CM | POA: Diagnosis not present

## 2018-09-11 DIAGNOSIS — R29898 Other symptoms and signs involving the musculoskeletal system: Secondary | ICD-10-CM | POA: Diagnosis not present

## 2018-09-11 DIAGNOSIS — M5127 Other intervertebral disc displacement, lumbosacral region: Secondary | ICD-10-CM | POA: Diagnosis not present

## 2018-09-11 DIAGNOSIS — M4807 Spinal stenosis, lumbosacral region: Secondary | ICD-10-CM | POA: Diagnosis not present

## 2018-09-13 DIAGNOSIS — M5441 Lumbago with sciatica, right side: Secondary | ICD-10-CM | POA: Diagnosis not present

## 2018-09-13 DIAGNOSIS — I69959 Hemiplegia and hemiparesis following unspecified cerebrovascular disease affecting unspecified side: Secondary | ICD-10-CM | POA: Diagnosis not present

## 2018-09-13 DIAGNOSIS — Z79899 Other long term (current) drug therapy: Secondary | ICD-10-CM | POA: Diagnosis not present

## 2018-09-13 DIAGNOSIS — M48061 Spinal stenosis, lumbar region without neurogenic claudication: Secondary | ICD-10-CM | POA: Diagnosis not present

## 2018-09-14 DIAGNOSIS — M5126 Other intervertebral disc displacement, lumbar region: Secondary | ICD-10-CM | POA: Diagnosis not present

## 2018-09-14 DIAGNOSIS — R030 Elevated blood-pressure reading, without diagnosis of hypertension: Secondary | ICD-10-CM | POA: Diagnosis not present

## 2018-09-15 DIAGNOSIS — M48062 Spinal stenosis, lumbar region with neurogenic claudication: Secondary | ICD-10-CM | POA: Diagnosis not present

## 2018-09-15 DIAGNOSIS — M5126 Other intervertebral disc displacement, lumbar region: Secondary | ICD-10-CM | POA: Diagnosis not present

## 2018-12-13 DIAGNOSIS — K5900 Constipation, unspecified: Secondary | ICD-10-CM | POA: Diagnosis not present

## 2018-12-13 DIAGNOSIS — Z6823 Body mass index (BMI) 23.0-23.9, adult: Secondary | ICD-10-CM | POA: Diagnosis not present

## 2018-12-13 DIAGNOSIS — R339 Retention of urine, unspecified: Secondary | ICD-10-CM | POA: Diagnosis not present

## 2018-12-21 DIAGNOSIS — N200 Calculus of kidney: Secondary | ICD-10-CM | POA: Diagnosis not present

## 2018-12-21 DIAGNOSIS — F17210 Nicotine dependence, cigarettes, uncomplicated: Secondary | ICD-10-CM | POA: Diagnosis not present

## 2018-12-21 DIAGNOSIS — R1032 Left lower quadrant pain: Secondary | ICD-10-CM | POA: Diagnosis not present

## 2018-12-21 DIAGNOSIS — N3001 Acute cystitis with hematuria: Secondary | ICD-10-CM | POA: Diagnosis not present

## 2018-12-21 DIAGNOSIS — B9689 Other specified bacterial agents as the cause of diseases classified elsewhere: Secondary | ICD-10-CM | POA: Diagnosis not present

## 2018-12-21 DIAGNOSIS — Z6823 Body mass index (BMI) 23.0-23.9, adult: Secondary | ICD-10-CM | POA: Diagnosis not present

## 2019-01-13 DIAGNOSIS — Z125 Encounter for screening for malignant neoplasm of prostate: Secondary | ICD-10-CM | POA: Diagnosis not present

## 2019-01-13 DIAGNOSIS — Z9181 History of falling: Secondary | ICD-10-CM | POA: Diagnosis not present

## 2019-01-13 DIAGNOSIS — Z0000 Encounter for general adult medical examination without abnormal findings: Secondary | ICD-10-CM | POA: Diagnosis not present

## 2019-01-13 DIAGNOSIS — Z1331 Encounter for screening for depression: Secondary | ICD-10-CM | POA: Diagnosis not present

## 2019-07-12 DIAGNOSIS — I69959 Hemiplegia and hemiparesis following unspecified cerebrovascular disease affecting unspecified side: Secondary | ICD-10-CM | POA: Diagnosis not present

## 2019-07-12 DIAGNOSIS — G2581 Restless legs syndrome: Secondary | ICD-10-CM | POA: Diagnosis not present

## 2019-07-12 DIAGNOSIS — Z139 Encounter for screening, unspecified: Secondary | ICD-10-CM | POA: Diagnosis not present

## 2019-07-12 DIAGNOSIS — Z136 Encounter for screening for cardiovascular disorders: Secondary | ICD-10-CM | POA: Diagnosis not present

## 2019-07-12 DIAGNOSIS — J449 Chronic obstructive pulmonary disease, unspecified: Secondary | ICD-10-CM | POA: Diagnosis not present

## 2019-07-12 DIAGNOSIS — R29898 Other symptoms and signs involving the musculoskeletal system: Secondary | ICD-10-CM | POA: Diagnosis not present

## 2019-07-12 DIAGNOSIS — G4700 Insomnia, unspecified: Secondary | ICD-10-CM | POA: Diagnosis not present

## 2020-01-25 DIAGNOSIS — G2581 Restless legs syndrome: Secondary | ICD-10-CM | POA: Diagnosis not present

## 2020-01-25 DIAGNOSIS — R29898 Other symptoms and signs involving the musculoskeletal system: Secondary | ICD-10-CM | POA: Diagnosis not present

## 2020-01-25 DIAGNOSIS — I69959 Hemiplegia and hemiparesis following unspecified cerebrovascular disease affecting unspecified side: Secondary | ICD-10-CM | POA: Diagnosis not present

## 2020-01-25 DIAGNOSIS — G4700 Insomnia, unspecified: Secondary | ICD-10-CM | POA: Diagnosis not present

## 2020-01-25 DIAGNOSIS — J449 Chronic obstructive pulmonary disease, unspecified: Secondary | ICD-10-CM | POA: Diagnosis not present

## 2020-07-25 DIAGNOSIS — Z20822 Contact with and (suspected) exposure to covid-19: Secondary | ICD-10-CM | POA: Diagnosis not present

## 2020-08-27 DIAGNOSIS — Z125 Encounter for screening for malignant neoplasm of prostate: Secondary | ICD-10-CM | POA: Diagnosis not present

## 2020-08-27 DIAGNOSIS — Z9181 History of falling: Secondary | ICD-10-CM | POA: Diagnosis not present

## 2020-08-27 DIAGNOSIS — R29898 Other symptoms and signs involving the musculoskeletal system: Secondary | ICD-10-CM | POA: Diagnosis not present

## 2020-08-27 DIAGNOSIS — G2581 Restless legs syndrome: Secondary | ICD-10-CM | POA: Diagnosis not present

## 2020-08-27 DIAGNOSIS — I69959 Hemiplegia and hemiparesis following unspecified cerebrovascular disease affecting unspecified side: Secondary | ICD-10-CM | POA: Diagnosis not present

## 2020-08-27 DIAGNOSIS — J449 Chronic obstructive pulmonary disease, unspecified: Secondary | ICD-10-CM | POA: Diagnosis not present

## 2020-08-27 DIAGNOSIS — N529 Male erectile dysfunction, unspecified: Secondary | ICD-10-CM | POA: Diagnosis not present

## 2020-08-27 DIAGNOSIS — F339 Major depressive disorder, recurrent, unspecified: Secondary | ICD-10-CM | POA: Diagnosis not present

## 2020-08-27 DIAGNOSIS — G4700 Insomnia, unspecified: Secondary | ICD-10-CM | POA: Diagnosis not present

## 2020-08-27 DIAGNOSIS — F430 Acute stress reaction: Secondary | ICD-10-CM | POA: Diagnosis not present

## 2020-08-27 DIAGNOSIS — F410 Panic disorder [episodic paroxysmal anxiety] without agoraphobia: Secondary | ICD-10-CM | POA: Diagnosis not present

## 2020-08-27 DIAGNOSIS — D582 Other hemoglobinopathies: Secondary | ICD-10-CM | POA: Diagnosis not present

## 2020-12-10 DIAGNOSIS — Z6821 Body mass index (BMI) 21.0-21.9, adult: Secondary | ICD-10-CM | POA: Diagnosis not present

## 2020-12-10 DIAGNOSIS — I739 Peripheral vascular disease, unspecified: Secondary | ICD-10-CM | POA: Diagnosis not present

## 2020-12-10 DIAGNOSIS — R1031 Right lower quadrant pain: Secondary | ICD-10-CM | POA: Diagnosis not present

## 2020-12-10 DIAGNOSIS — M47816 Spondylosis without myelopathy or radiculopathy, lumbar region: Secondary | ICD-10-CM | POA: Diagnosis not present

## 2020-12-10 DIAGNOSIS — R1013 Epigastric pain: Secondary | ICD-10-CM | POA: Diagnosis not present

## 2020-12-10 DIAGNOSIS — R102 Pelvic and perineal pain: Secondary | ICD-10-CM | POA: Diagnosis not present

## 2020-12-10 DIAGNOSIS — M47817 Spondylosis without myelopathy or radiculopathy, lumbosacral region: Secondary | ICD-10-CM | POA: Diagnosis not present

## 2020-12-11 DIAGNOSIS — R1031 Right lower quadrant pain: Secondary | ICD-10-CM | POA: Diagnosis not present

## 2020-12-26 ENCOUNTER — Encounter: Payer: Self-pay | Admitting: Vascular Surgery

## 2020-12-26 ENCOUNTER — Other Ambulatory Visit: Payer: Self-pay

## 2020-12-26 ENCOUNTER — Ambulatory Visit (INDEPENDENT_AMBULATORY_CARE_PROVIDER_SITE_OTHER): Payer: Medicare Other | Admitting: Vascular Surgery

## 2020-12-26 VITALS — BP 138/76 | HR 70 | Temp 97.6°F | Resp 20 | Ht 68.0 in | Wt 143.0 lb

## 2020-12-26 DIAGNOSIS — F17210 Nicotine dependence, cigarettes, uncomplicated: Secondary | ICD-10-CM | POA: Diagnosis not present

## 2020-12-26 DIAGNOSIS — I739 Peripheral vascular disease, unspecified: Secondary | ICD-10-CM | POA: Diagnosis not present

## 2020-12-26 NOTE — Progress Notes (Signed)
REASON FOR CONSULT:    Superficial femoral artery occlusion.  The consult is requested by Heide Guile, FNP.  ASSESSMENT & PLAN:   BILATERAL INFRAINGUINAL ARTERIAL OCCLUSIVE DISEASE: Based on his CT scan and my exam this patient has evidence of bilateral infrainguinal arterial occlusive disease.  However he is essentially asymptomatic.  He does not describe any claudication, rest pain, or nonhealing ulcers.  He is fairly active despite his previous stroke and I suspect he is developed extensive collaterals.  Given that he is asymptomatic I would not recommend an aggressive approach to the finding on his CT of the pelvis which shows bilateral superficial femoral artery occlusions.  We have discussed the importance of tobacco cessation (3 min).  I have encouraged him to stay as active as possible.  We also discussed the importance of nutrition.  I favor a plant-based diet.  I have ordered follow-up ABIs in 1 year I will see him back at that time.  He knows to call sooner if he has problems.  If he developed disabling claudication, rest pain, or nonhealing ulcer and certainly we could consider arteriography.  I will see him back in 1 year.  I have encouraged him to take the 81 mg of aspirin daily.  I would also consider starting him on a low-dose statin but will leave that to his primary care physician.  Waverly Ferrari, MD Office: [phone]   HPI:   Dennis Mclean is a pleasant 70 y.o. male, who had developed right groin pain about 3 weeks ago.  This developed suddenly.  It was getting somewhat better but then a week ago he threw a box and it got significantly worse again.  This prompted a CT of the pelvis and an incidental finding was bilateral superficial femoral artery occlusions.  He was sent for vascular consultation.  On my history, I do not get any history of claudication, rest pain, or nonhealing ulcers.  He did have a stroke in 2016 associated with right leg weakness.  Despite this  however he remains fairly active and walks quite a bit.  His risk factors for peripheral vascular disease include a family history of premature cardiovascular disease and tobacco use.  His father had heart disease in his mid 30s.  He smokes a pack per day of cigarettes and has been smoking for as long as he can remember.  He does take 81 mg of aspirin but not consistently.  Past Medical History:  Diagnosis Date  . COPD (chronic obstructive pulmonary disease) (HCC)   . DDD (degenerative disc disease), lumbar   . Stroke [facility])     Family History  Problem Relation Age of Onset  . Stroke Mother   . Heart disease Father     SOCIAL HISTORY: Social History   Socioeconomic History  . Marital status: Single    Spouse name: Not on file  . Number of children: 3  . Years of education: 33  . Highest education level: Not on file  Occupational History    Comment: retired  Tobacco Use  . Smoking status: Current Every Day Smoker    Packs/day: 1.00  . Smokeless tobacco: Never Used  . Tobacco comment: 12/06/15 < 1 PPD  Vaping Use  . Vaping Use: Never used  Substance and Sexual Activity  . Alcohol use: Yes    Comment: 3 beers a day  . Drug use: No    Comment: "many yrs ago as teenger"  . Sexual activity: Not on file  Other Topics Concern  . Not on file  Social History Narrative   Lives alone   Caffeine- coffee 2 cups daily   Social Determinants of Health   Financial Resource Strain: Not on file  Food Insecurity: Not on file  Transportation Needs: Not on file  Physical Activity: Not on file  Stress: Not on file  Social Connections: Not on file  Intimate Partner Violence: Not on file    No Known Allergies  Current Outpatient Medications  Medication Sig Dispense Refill  . albuterol (PROVENTIL) (2.5 MG/3ML) 0.083% nebulizer solution SMARTSIG:1 Vial(s) Via Nebulizer Every 4-6 Hours PRN    . albuterol (VENTOLIN HFA) 108 (90 Base) MCG/ACT inhaler SMARTSIG:2 Puff(s) By Mouth Every  4-6 Hours PRN    . ANORO ELLIPTA 62.5-25 MCG/INH AEPB INHALE 1 PUFF DAILY    . escitalopram (LEXAPRO) 20 MG tablet Take 1 tablet by mouth daily.    . pramipexole (MIRAPEX) 1 MG tablet Take 1 mg by mouth at bedtime.    Marland Kitchen zolpidem (AMBIEN) 10 MG tablet Take 10 mg by mouth at bedtime as needed. for sleep  5   No current facility-administered medications for this visit.    REVIEW OF SYSTEMS:  [X]  denotes positive finding, [ ]  denotes negative finding Cardiac  Comments:  Chest pain or chest pressure:    Shortness of breath upon exertion:    Short of breath when lying flat:    Irregular heart rhythm:        Vascular    Pain in calf, thigh, or hip brought on by ambulation:    Pain in feet at night that wakes you up from your sleep:     Blood clot in your veins:    Leg swelling:         Pulmonary    Oxygen at home:    Productive cough:     Wheezing:         Neurologic    Sudden weakness in arms or legs:     Sudden numbness in arms or legs:     Sudden onset of difficulty speaking or slurred speech:    Temporary loss of vision in one eye:     Problems with dizziness:         Gastrointestinal    Blood in stool:     Vomited blood:         Genitourinary    Burning when urinating:     Blood in urine:        Psychiatric    Major depression:         Hematologic    Bleeding problems:    Problems with blood clotting too easily:        Skin    Rashes or ulcers:        Constitutional    Fever or chills:     PHYSICAL EXAM:   Vitals:   12/26/20 1036  BP: 138/76  Pulse: 70  Resp: 20  Temp: 97.6 F (36.4 C)  SpO2: 97%  Weight: 143 lb (64.9 kg)  Height: 5\' 8"  (1.727 m)    GENERAL: The patient is a well-nourished male, in no acute distress. The vital signs are documented above. CARDIAC: There is a regular rate and rhythm.  VASCULAR: I do not detect carotid bruits. He has palpable femoral pulses. I cannot palpate popliteal or pedal pulses. On the right side he has a  monophasic posterior tibial and peroneal signal with a Doppler.  I cannot obtain  a dorsalis pedis or anterior tibial signal. On the left side he has a monophasic posterior tibial signal.  I cannot obtain an anterior tibial or peroneal signal. He has no significant lower extremity swelling. PULMONARY: There is good air exchange bilaterally without wheezing or rales. ABDOMEN: Soft and non-tender with normal pitched bowel sounds.  MUSCULOSKELETAL: There are no major deformities or cyanosis. NEUROLOGIC: No focal weakness or paresthesias are detected. SKIN: There are no ulcers or rashes noted. PSYCHIATRIC: The patient has a normal affect.  DATA:    CT PELVIS: I did review his CT of the pelvis.  On my review of the films it does look like he has bilateral superficial femoral artery occlusions.  This was a study of the pelvis only so there is limited visualization of the arteries and only the specific area was examined.

## 2021-01-24 DIAGNOSIS — Z23 Encounter for immunization: Secondary | ICD-10-CM | POA: Diagnosis not present

## 2021-02-21 DIAGNOSIS — Z23 Encounter for immunization: Secondary | ICD-10-CM | POA: Diagnosis not present

## 2021-03-29 DIAGNOSIS — F430 Acute stress reaction: Secondary | ICD-10-CM | POA: Diagnosis not present

## 2021-03-29 DIAGNOSIS — Z125 Encounter for screening for malignant neoplasm of prostate: Secondary | ICD-10-CM | POA: Diagnosis not present

## 2021-03-29 DIAGNOSIS — I69959 Hemiplegia and hemiparesis following unspecified cerebrovascular disease affecting unspecified side: Secondary | ICD-10-CM | POA: Diagnosis not present

## 2021-03-29 DIAGNOSIS — D582 Other hemoglobinopathies: Secondary | ICD-10-CM | POA: Diagnosis not present

## 2021-03-29 DIAGNOSIS — F410 Panic disorder [episodic paroxysmal anxiety] without agoraphobia: Secondary | ICD-10-CM | POA: Diagnosis not present

## 2021-03-29 DIAGNOSIS — G4700 Insomnia, unspecified: Secondary | ICD-10-CM | POA: Diagnosis not present

## 2021-03-29 DIAGNOSIS — Z9181 History of falling: Secondary | ICD-10-CM | POA: Diagnosis not present

## 2021-03-29 DIAGNOSIS — R29898 Other symptoms and signs involving the musculoskeletal system: Secondary | ICD-10-CM | POA: Diagnosis not present

## 2021-03-29 DIAGNOSIS — Z720 Tobacco use: Secondary | ICD-10-CM | POA: Diagnosis not present

## 2021-03-29 DIAGNOSIS — J449 Chronic obstructive pulmonary disease, unspecified: Secondary | ICD-10-CM | POA: Diagnosis not present

## 2021-03-29 DIAGNOSIS — I70209 Unspecified atherosclerosis of native arteries of extremities, unspecified extremity: Secondary | ICD-10-CM | POA: Diagnosis not present

## 2021-03-29 DIAGNOSIS — G2581 Restless legs syndrome: Secondary | ICD-10-CM | POA: Diagnosis not present

## 2021-03-29 DIAGNOSIS — F339 Major depressive disorder, recurrent, unspecified: Secondary | ICD-10-CM | POA: Diagnosis not present

## 2021-09-06 DIAGNOSIS — H43821 Vitreomacular adhesion, right eye: Secondary | ICD-10-CM | POA: Diagnosis not present

## 2021-09-06 DIAGNOSIS — H25813 Combined forms of age-related cataract, bilateral: Secondary | ICD-10-CM | POA: Diagnosis not present

## 2021-09-16 DIAGNOSIS — Z9181 History of falling: Secondary | ICD-10-CM | POA: Diagnosis not present

## 2021-09-16 DIAGNOSIS — Z125 Encounter for screening for malignant neoplasm of prostate: Secondary | ICD-10-CM | POA: Diagnosis not present

## 2021-09-16 DIAGNOSIS — Z0000 Encounter for general adult medical examination without abnormal findings: Secondary | ICD-10-CM | POA: Diagnosis not present

## 2021-09-16 DIAGNOSIS — Z1331 Encounter for screening for depression: Secondary | ICD-10-CM | POA: Diagnosis not present

## 2021-09-30 DIAGNOSIS — I739 Peripheral vascular disease, unspecified: Secondary | ICD-10-CM | POA: Diagnosis not present

## 2021-09-30 DIAGNOSIS — I69959 Hemiplegia and hemiparesis following unspecified cerebrovascular disease affecting unspecified side: Secondary | ICD-10-CM | POA: Diagnosis not present

## 2021-09-30 DIAGNOSIS — Z87891 Personal history of nicotine dependence: Secondary | ICD-10-CM | POA: Diagnosis not present

## 2021-09-30 DIAGNOSIS — J449 Chronic obstructive pulmonary disease, unspecified: Secondary | ICD-10-CM | POA: Diagnosis not present

## 2021-09-30 DIAGNOSIS — E7800 Pure hypercholesterolemia, unspecified: Secondary | ICD-10-CM | POA: Diagnosis not present

## 2021-09-30 DIAGNOSIS — F410 Panic disorder [episodic paroxysmal anxiety] without agoraphobia: Secondary | ICD-10-CM | POA: Diagnosis not present

## 2021-09-30 DIAGNOSIS — R7989 Other specified abnormal findings of blood chemistry: Secondary | ICD-10-CM | POA: Diagnosis not present

## 2021-09-30 DIAGNOSIS — I700 Atherosclerosis of aorta: Secondary | ICD-10-CM | POA: Diagnosis not present

## 2021-09-30 DIAGNOSIS — F430 Acute stress reaction: Secondary | ICD-10-CM | POA: Diagnosis not present

## 2021-10-02 DIAGNOSIS — H43821 Vitreomacular adhesion, right eye: Secondary | ICD-10-CM | POA: Diagnosis not present

## 2021-10-02 DIAGNOSIS — H2513 Age-related nuclear cataract, bilateral: Secondary | ICD-10-CM | POA: Diagnosis not present

## 2021-10-02 DIAGNOSIS — H353221 Exudative age-related macular degeneration, left eye, with active choroidal neovascularization: Secondary | ICD-10-CM | POA: Diagnosis not present

## 2021-10-02 DIAGNOSIS — H353112 Nonexudative age-related macular degeneration, right eye, intermediate dry stage: Secondary | ICD-10-CM | POA: Diagnosis not present

## 2021-10-08 DIAGNOSIS — I700 Atherosclerosis of aorta: Secondary | ICD-10-CM | POA: Diagnosis not present

## 2021-10-08 DIAGNOSIS — Z122 Encounter for screening for malignant neoplasm of respiratory organs: Secondary | ICD-10-CM | POA: Diagnosis not present

## 2021-10-08 DIAGNOSIS — F17210 Nicotine dependence, cigarettes, uncomplicated: Secondary | ICD-10-CM | POA: Diagnosis not present

## 2021-10-08 DIAGNOSIS — R918 Other nonspecific abnormal finding of lung field: Secondary | ICD-10-CM | POA: Diagnosis not present

## 2021-10-08 DIAGNOSIS — J432 Centrilobular emphysema: Secondary | ICD-10-CM | POA: Diagnosis not present

## 2021-10-15 DIAGNOSIS — K6289 Other specified diseases of anus and rectum: Secondary | ICD-10-CM | POA: Diagnosis not present

## 2021-10-15 DIAGNOSIS — Z5321 Procedure and treatment not carried out due to patient leaving prior to being seen by health care provider: Secondary | ICD-10-CM | POA: Diagnosis not present

## 2021-10-16 DIAGNOSIS — K6289 Other specified diseases of anus and rectum: Secondary | ICD-10-CM | POA: Diagnosis not present

## 2021-10-16 DIAGNOSIS — N3289 Other specified disorders of bladder: Secondary | ICD-10-CM | POA: Diagnosis not present

## 2021-10-16 DIAGNOSIS — M47816 Spondylosis without myelopathy or radiculopathy, lumbar region: Secondary | ICD-10-CM | POA: Diagnosis not present

## 2021-10-16 DIAGNOSIS — R102 Pelvic and perineal pain: Secondary | ICD-10-CM | POA: Diagnosis not present

## 2021-10-18 DIAGNOSIS — H2513 Age-related nuclear cataract, bilateral: Secondary | ICD-10-CM | POA: Diagnosis not present

## 2021-10-18 DIAGNOSIS — Z01818 Encounter for other preprocedural examination: Secondary | ICD-10-CM | POA: Diagnosis not present

## 2021-10-18 DIAGNOSIS — H2511 Age-related nuclear cataract, right eye: Secondary | ICD-10-CM | POA: Diagnosis not present

## 2021-10-18 DIAGNOSIS — H2512 Age-related nuclear cataract, left eye: Secondary | ICD-10-CM | POA: Diagnosis not present

## 2021-10-30 DIAGNOSIS — H2512 Age-related nuclear cataract, left eye: Secondary | ICD-10-CM | POA: Diagnosis not present

## 2021-10-31 DIAGNOSIS — H353221 Exudative age-related macular degeneration, left eye, with active choroidal neovascularization: Secondary | ICD-10-CM | POA: Diagnosis not present

## 2021-11-13 DIAGNOSIS — H2511 Age-related nuclear cataract, right eye: Secondary | ICD-10-CM | POA: Diagnosis not present

## 2021-12-05 DIAGNOSIS — Z0100 Encounter for examination of eyes and vision without abnormal findings: Secondary | ICD-10-CM | POA: Diagnosis not present

## 2021-12-23 ENCOUNTER — Other Ambulatory Visit: Payer: Self-pay

## 2021-12-23 DIAGNOSIS — I739 Peripheral vascular disease, unspecified: Secondary | ICD-10-CM

## 2021-12-23 NOTE — Progress Notes (Signed)
Error

## 2022-01-02 ENCOUNTER — Ambulatory Visit (HOSPITAL_COMMUNITY)
Admission: RE | Admit: 2022-01-02 | Discharge: 2022-01-02 | Disposition: A | Discharge status: Home / Self Care | Payer: Medicare HMO | Source: Ambulatory Visit | Attending: Vascular Surgery | Admitting: Vascular Surgery

## 2022-01-02 ENCOUNTER — Ambulatory Visit: Payer: Medicare HMO | Admitting: Vascular Surgery

## 2022-01-02 ENCOUNTER — Encounter: Payer: Self-pay | Admitting: Vascular Surgery

## 2022-01-02 VITALS — BP 189/84 | HR 70 | Temp 98.4°F | Resp 20 | Ht 68.0 in | Wt 141.0 lb

## 2022-01-02 DIAGNOSIS — I739 Peripheral vascular disease, unspecified: Secondary | ICD-10-CM | POA: Insufficient documentation

## 2022-01-02 NOTE — Progress Notes (Signed)
? ? ?REASON FOR VISIT:  ? ?Follow-up of peripheral arterial disease ? ?MEDICAL ISSUES:  ? ?PERIPHERAL ARTERIAL DISEASE: Based on his exam he has evidence of infrainguinal arterial occlusive disease bilaterally.  His symptoms are more significant on the right side.  We have again discussed the importance of tobacco cessation.  He smokes about a pack per day.  I have encouraged him to continue walking is much as possible to promote development of collaterals.  He was just recently started on a statin by his primary care physician which is excellent.  I have again asked him to begin taking 81 mg of aspirin daily.  He is very concerned about his skin being too thin so as a compromise I suggested maybe taking this 3 times a week.  Currently his claudication is stable.  If his claudication progresses and becomes disabling or he develops rest pain or nonhealing ulcer that I think we need to proceed with arteriography. ? ?HISTORY OF MILD CAROTID DISEASE: He does have a history of some mild carotid disease and has not had a study since 2017.  For this reason when he comes back in 6 months we will get a carotid duplex scan.  He has had a stroke in the past but none recently.  He denies any recent focal weakness or paresthesias. ? ? ?HPI:  ? ?Dennis Mclean is a pleasant 71 y.o. male who I saw in consultation with peripheral arterial disease on 12/26/2020.  Based on his CT scan and my exam the patient had evidence of infrainguinal arterial occlusive disease which was more significant on the right side.  However he was essentially asymptomatic.  We discussed the importance of tobacco cessation.  I encouraged him to stay as active as possible.  We also discussed the importance of nutrition.  I set up a follow-up visit in 1 year.  I encouraged him to begin taking aspirin daily and consider taking a low-dose statin after discussing this with his primary care physician. ? ?Since I saw him last, he continues to have claudication in  both legs but more significantly on the right side.  His driveway is about a 50 feet long and he can walk almost to the end of the driveway before experiencing symptoms.  He denies any history of rest pain or nonhealing ulcers.  He does continue to smoke 1 pack/day of cigarettes.  He tells me that his primary care physician just recently started him on a statin. ? ?He does have a remote history of a stroke but is had no recent history of stroke, TIAs, expressive or receptive aphasia, or amaurosis fugax. ? ?Past Medical History:  ?Diagnosis Date  ? COPD (chronic obstructive pulmonary disease) (HCC)   ? DDD (degenerative disc disease), lumbar   ? Stroke [facility](HCC)   ? ? ?Family History  ?Problem Relation Age of Onset  ? Stroke Mother   ? Heart disease Father   ? ? ?SOCIAL HISTORY: ?Social History  ? ?Tobacco Use  ? Smoking status: Every Day  ?  Packs/day: 1.00  ?  Types: Cigarettes  ? Smokeless tobacco: Never  ? Tobacco comments:  ?  12/06/15 < 1 PPD  ?Substance Use Topics  ? Alcohol use: Yes  ?  Comment: 3 beers a day  ? ? ?No Known Allergies ? ?Current Outpatient Medications  ?Medication Sig Dispense Refill  ? albuterol (PROVENTIL) (2.5 MG/3ML) 0.083% nebulizer solution SMARTSIG:1 Vial(s) Via Nebulizer Every 4-6 Hours PRN    ? albuterol (VENTOLIN  HFA) 108 (90 Base) MCG/ACT inhaler SMARTSIG:2 Puff(s) By Mouth Every 4-6 Hours PRN    ? ANORO ELLIPTA 62.5-25 MCG/INH AEPB INHALE 1 PUFF DAILY    ? escitalopram (LEXAPRO) 20 MG tablet Take 1 tablet by mouth daily.    ? pramipexole (MIRAPEX) 1 MG tablet Take 1 mg by mouth at bedtime.    ? zolpidem (AMBIEN) 10 MG tablet Take 10 mg by mouth at bedtime as needed. for sleep  5  ? ?No current facility-administered medications for this visit.  ? ? ?REVIEW OF SYSTEMS:  ?[X]  denotes positive finding, [ ]  denotes negative finding ?Cardiac  Comments:  ?Chest pain or chest pressure:    ?Shortness of breath upon exertion:    ?Short of breath when lying flat:    ?Irregular heart rhythm:    ?     ?Vascular    ?Pain in calf, thigh, or hip brought on by ambulation: x   ?Pain in feet at night that wakes you up from your sleep:     ?Blood clot in your veins:    ?Leg swelling:     ?    ?Pulmonary    ?Oxygen at home:    ?Productive cough:     ?Wheezing:     ?    ?Neurologic    ?Sudden weakness in arms or legs:     ?Sudden numbness in arms or legs:     ?Sudden onset of difficulty speaking or slurred speech:    ?Temporary loss of vision in one eye:     ?Problems with dizziness:     ?    ?Gastrointestinal    ?Blood in stool:     ?Vomited blood:     ?    ?Genitourinary    ?Burning when urinating:     ?Blood in urine:    ?    ?Psychiatric    ?Major depression:     ?    ?Hematologic    ?Bleeding problems:    ?Problems with blood clotting too easily:    ?    ?Skin    ?Rashes or ulcers:    ?    ?Constitutional    ?Fever or chills:    ? ?PHYSICAL EXAM:  ? ?Vitals:  ? 01/02/22 0834  ?BP: (!) 189/84  ?Pulse: 70  ?Resp: 20  ?Temp: 98.4 ?F (36.9 ?C)  ?SpO2: 95%  ?Weight: 141 lb (64 kg)  ?Height: 5\' 8"  (1.727 m)  ? ? ?GENERAL: The patient is a well-nourished male, in no acute distress. The vital signs are documented above. ?CARDIAC: There is a regular rate and rhythm.  ?VASCULAR: I do not detect carotid bruits. ?He has palpable femoral pulses.  I cannot palpate popliteal or pedal pulses. ?He has no significant lower extremity swelling. ?PULMONARY: There is good air exchange bilaterally without wheezing or rales. ?ABDOMEN: Soft and non-tender with normal pitched bowel sounds.  I do not palpate an aneurysm. ?MUSCULOSKELETAL: There are no major deformities or cyanosis. ?NEUROLOGIC: No focal weakness or paresthesias are detected. ?SKIN: There are no ulcers or rashes noted. ?PSYCHIATRIC: The patient has a normal affect. ? ?DATA:   ? ?ARTERIAL DOPPLER STUDY: I have independently interpreted his arterial Doppler study today. ? ?On the right side there is a monophasic dorsalis pedis and posterior tibial signal.  ABI is 61%.  Toe  pressures 83 mmHg. ? ?On the left side there is a biphasic dorsalis pedis and posterior tibial signal.  ABI is 73%.  Toe pressures 123  mmHg. ? ?I did find a carotid duplex scan that was done back in 2017 that was fairly unremarkable with no significant carotid disease on either side. ? ?Waverly Ferrari ?[facility] ?Office [phone] ?

## 2022-01-06 DIAGNOSIS — R911 Solitary pulmonary nodule: Secondary | ICD-10-CM | POA: Diagnosis not present

## 2022-01-06 DIAGNOSIS — I2510 Atherosclerotic heart disease of native coronary artery without angina pectoris: Secondary | ICD-10-CM | POA: Diagnosis not present

## 2022-01-06 DIAGNOSIS — I700 Atherosclerosis of aorta: Secondary | ICD-10-CM | POA: Diagnosis not present

## 2022-01-07 ENCOUNTER — Other Ambulatory Visit: Payer: Self-pay | Admitting: *Deleted

## 2022-01-07 DIAGNOSIS — I739 Peripheral vascular disease, unspecified: Secondary | ICD-10-CM

## 2022-01-07 DIAGNOSIS — R0989 Other specified symptoms and signs involving the circulatory and respiratory systems: Secondary | ICD-10-CM

## 2022-03-28 DIAGNOSIS — E7800 Pure hypercholesterolemia, unspecified: Secondary | ICD-10-CM | POA: Diagnosis not present

## 2022-03-28 DIAGNOSIS — Z79899 Other long term (current) drug therapy: Secondary | ICD-10-CM | POA: Diagnosis not present

## 2022-03-28 DIAGNOSIS — F430 Acute stress reaction: Secondary | ICD-10-CM | POA: Diagnosis not present

## 2022-03-28 DIAGNOSIS — Z125 Encounter for screening for malignant neoplasm of prostate: Secondary | ICD-10-CM | POA: Diagnosis not present

## 2022-03-28 DIAGNOSIS — R7989 Other specified abnormal findings of blood chemistry: Secondary | ICD-10-CM | POA: Diagnosis not present

## 2022-03-28 DIAGNOSIS — R911 Solitary pulmonary nodule: Secondary | ICD-10-CM | POA: Diagnosis not present

## 2022-03-28 DIAGNOSIS — J439 Emphysema, unspecified: Secondary | ICD-10-CM | POA: Diagnosis not present

## 2022-03-28 DIAGNOSIS — I739 Peripheral vascular disease, unspecified: Secondary | ICD-10-CM | POA: Diagnosis not present

## 2022-03-28 DIAGNOSIS — I700 Atherosclerosis of aorta: Secondary | ICD-10-CM | POA: Diagnosis not present

## 2022-03-28 DIAGNOSIS — F410 Panic disorder [episodic paroxysmal anxiety] without agoraphobia: Secondary | ICD-10-CM | POA: Diagnosis not present

## 2022-03-28 DIAGNOSIS — I69959 Hemiplegia and hemiparesis following unspecified cerebrovascular disease affecting unspecified side: Secondary | ICD-10-CM | POA: Diagnosis not present

## 2022-09-30 DIAGNOSIS — Z79899 Other long term (current) drug therapy: Secondary | ICD-10-CM | POA: Diagnosis not present

## 2022-09-30 DIAGNOSIS — R911 Solitary pulmonary nodule: Secondary | ICD-10-CM | POA: Diagnosis not present

## 2022-09-30 DIAGNOSIS — N529 Male erectile dysfunction, unspecified: Secondary | ICD-10-CM | POA: Diagnosis not present

## 2022-09-30 DIAGNOSIS — F410 Panic disorder [episodic paroxysmal anxiety] without agoraphobia: Secondary | ICD-10-CM | POA: Diagnosis not present

## 2022-09-30 DIAGNOSIS — E7800 Pure hypercholesterolemia, unspecified: Secondary | ICD-10-CM | POA: Diagnosis not present

## 2022-09-30 DIAGNOSIS — I69959 Hemiplegia and hemiparesis following unspecified cerebrovascular disease affecting unspecified side: Secondary | ICD-10-CM | POA: Diagnosis not present

## 2022-09-30 DIAGNOSIS — J439 Emphysema, unspecified: Secondary | ICD-10-CM | POA: Diagnosis not present

## 2022-09-30 DIAGNOSIS — I700 Atherosclerosis of aorta: Secondary | ICD-10-CM | POA: Diagnosis not present

## 2022-09-30 DIAGNOSIS — F430 Acute stress reaction: Secondary | ICD-10-CM | POA: Diagnosis not present

## 2022-09-30 DIAGNOSIS — I739 Peripheral vascular disease, unspecified: Secondary | ICD-10-CM | POA: Diagnosis not present

## 2022-10-01 DIAGNOSIS — R059 Cough, unspecified: Secondary | ICD-10-CM | POA: Diagnosis not present

## 2022-10-02 ENCOUNTER — Ambulatory Visit: Payer: Medicare HMO | Admitting: Vascular Surgery

## 2022-10-02 ENCOUNTER — Encounter: Payer: Self-pay | Admitting: Vascular Surgery

## 2022-10-02 ENCOUNTER — Ambulatory Visit (HOSPITAL_COMMUNITY)
Admission: RE | Admit: 2022-10-02 | Discharge: 2022-10-02 | Disposition: A | Discharge status: Home / Self Care | Payer: Medicare HMO | Source: Ambulatory Visit | Attending: Vascular Surgery | Admitting: Vascular Surgery

## 2022-10-02 ENCOUNTER — Ambulatory Visit (INDEPENDENT_AMBULATORY_CARE_PROVIDER_SITE_OTHER)
Admission: RE | Admit: 2022-10-02 | Discharge: 2022-10-02 | Disposition: A | Discharge status: Home / Self Care | Payer: Medicare HMO | Source: Ambulatory Visit | Attending: Vascular Surgery | Admitting: Vascular Surgery

## 2022-10-02 VITALS — BP 160/80 | HR 73 | Temp 98.5°F | Resp 20 | Ht 68.0 in | Wt 149.0 lb

## 2022-10-02 DIAGNOSIS — I739 Peripheral vascular disease, unspecified: Secondary | ICD-10-CM

## 2022-10-02 DIAGNOSIS — R0989 Other specified symptoms and signs involving the circulatory and respiratory systems: Secondary | ICD-10-CM | POA: Diagnosis not present

## 2022-10-02 LAB — VAS US ABI WITH/WO TBI
Left ABI: 0.71
Right ABI: 0.54

## 2022-10-02 NOTE — Progress Notes (Signed)
REASON FOR VISIT:   Follow-up of peripheral arterial disease  MEDICAL ISSUES:   PERIPHERAL ARTERIAL DISEASE: This patient has stable infrainguinal arterial occlusive disease bilaterally.  This disease is more significant on the right side where he has right leg claudication.  He occasionally gets some pain at night but this is not consistent.  He denies any history of nonhealing wounds.  He is in the process of trying to quit tobacco and is using patches.  I encouraged him to stay as active as possible.  I have ordered follow-up ABIs in 1 year and he will be seen on the PA schedule at that time.  I informed him that I will be retiring in September.  He knows to call sooner if he develops worsening symptoms.  If his symptoms do progress I think we should proceed with arteriography and further assess his options for revascularization.  We did discuss this option today and he would prefer to hold off for now which I think is reasonable.  HISTORY OF MILD CAROTID DISEASE: This patient has a history of mild carotid disease based on a study back in 2017.  He had not had this assessed in some time so I had set him up for a carotid duplex during this visit.  This shows no evidence of carotid disease bilaterally.  Likewise both vertebral arteries are patent with antegrade flow.  I do not think routine follow-up is needed for his carotids.  HPI:   Dennis Mclean is a pleasant 72 y.o. male who I last saw on 01/02/2022.  The patient has infrainguinal arterial occlusive disease bilaterally.  Symptoms were more significant on the right side.  When I saw him last we discussed the importance of tobacco cessation.  He smokes about a pack a day.  We discussed importance of a structured walking program.  I also encouraged him to begin taking aspirin daily.  His claudication symptoms were stable.  I explained we would only consider arteriography if his symptoms progress significantly.  He comes in for a 28-month follow-up  visit.  Since I saw him last, he continues to have claudication in the right calf.  This occurs at about 50 yards.  He has minimal symptoms on the left side.  He occasionally gets some pain in his foot at night that is relieved with dependency but this does not occur always.  He denies any history of nonhealing wounds.  He also occasionally gets some right knee pain.  He continues to smoke 1 pack/day of cigarettes but just recently started patches and is going to try to quit.  Past Medical History:  Diagnosis Date   COPD (chronic obstructive pulmonary disease) (HCC)    DDD (degenerative disc disease), lumbar    Stroke ([place])     Family History  Problem Relation Age of Onset   Stroke Mother    Heart disease Father     SOCIAL HISTORY: Social History   Tobacco Use   Smoking status: Every Day    Packs/day: 1.00    Types: Cigarettes   Smokeless tobacco: Never   Tobacco comments:    12/06/15 < 1 PPD  Substance Use Topics   Alcohol use: Yes    Comment: 3 beers a day    No Known Allergies  Current Outpatient Medications  Medication Sig Dispense Refill   albuterol (PROVENTIL) (2.5 MG/3ML) 0.083% nebulizer solution SMARTSIG:1 Vial(s) Via Nebulizer Every 4-6 Hours PRN     albuterol (VENTOLIN HFA) 108 (90  Base) MCG/ACT inhaler SMARTSIG:2 Puff(s) By Mouth Every 4-6 Hours PRN     ANORO ELLIPTA 62.5-25 MCG/INH AEPB INHALE 1 PUFF DAILY     escitalopram (LEXAPRO) 20 MG tablet Take 1 tablet by mouth daily.     pramipexole (MIRAPEX) 1 MG tablet Take 1 mg by mouth at bedtime.     zolpidem (AMBIEN) 10 MG tablet Take 10 mg by mouth at bedtime as needed. for sleep  5   No current facility-administered medications for this visit.    REVIEW OF SYSTEMS:  [X]  denotes positive finding, [ ]  denotes negative finding Cardiac  Comments:  Chest pain or chest pressure:    Shortness of breath upon exertion: x   Short of breath when lying flat: x   Irregular heart rhythm:        Vascular    Pain in  calf, thigh, or hip brought on by ambulation:    Pain in feet at night that wakes you up from your sleep:     Blood clot in your veins:    Leg swelling:         Pulmonary    Oxygen at home:    Productive cough:     Wheezing:  x       Neurologic    Sudden weakness in arms or legs:     Sudden numbness in arms or legs:     Sudden onset of difficulty speaking or slurred speech:    Temporary loss of vision in one eye:     Problems with dizziness:         Gastrointestinal    Blood in stool:     Vomited blood:         Genitourinary    Burning when urinating:     Blood in urine:        Psychiatric    Major depression:         Hematologic    Bleeding problems:    Problems with blood clotting too easily:        Skin    Rashes or ulcers:        Constitutional    Fever or chills:     PHYSICAL EXAM:   Vitals:   10/02/22 1523 10/02/22 1524  BP: (!) 158/80 (!) 160/80  Pulse: 73   Resp: 20   Temp: 98.5 F (36.9 C)   SpO2: 93%   Weight: 149 lb (67.6 kg)   Height: 5\' 8"  (1.727 m)     GENERAL: The patient is a well-nourished male, in no acute distress. The vital signs are documented above. CARDIAC: There is a regular rate and rhythm.  VASCULAR: I do not detect carotid bruits. On the right side he has a palpable femoral pulse.  I cannot palpate a popliteal or pedal pulses. On the left he has a palpable femoral and popliteal pulse.  I cannot palpate pedal pulses. He has no significant lower extremity swelling. PULMONARY: There is good air exchange bilaterally without wheezing or rales. ABDOMEN: Soft and non-tender with normal pitched bowel sounds.  MUSCULOSKELETAL: There are no major deformities or cyanosis. NEUROLOGIC: No focal weakness or paresthesias are detected. SKIN: There are no ulcers or rashes noted. PSYCHIATRIC: The patient has a normal affect.  DATA:    ARTERIAL DOPPLER STUDY: I have independently interpreted his arterial Doppler study today.  On the right  side, there is a monophasic posterior tibial and dorsalis pedis signal.  ABIs 54%.  Toe pressures 32 mmHg.  Previous ABI on the right was 61%.  On the left side there is a biphasic posterior tibial and dorsalis pedis signal.  ABI 71%.  Toe pressures 24 mmHg.  Previous ABI was 73%.  CAROTID DUPLEX: I have independently interpreted his carotid duplex scan today.   On the right side there is no significant stenosis noted.  The right vertebral artery is patent with antegrade flow.  The subclavian artery has multiphasic flow.  On the left side there is no significant stenosis noted.  The vertebral artery has antegrade flow.  There is multiphasic flow in the left subclavian artery.  Deitra Mayo [facility] [phone]

## 2023-01-22 ENCOUNTER — Other Ambulatory Visit (HOSPITAL_COMMUNITY)
Admission: RE | Admit: 2023-01-22 | Discharge: 2023-01-22 | Disposition: A | Discharge status: Home / Self Care | Payer: Medicare HMO | Source: Ambulatory Visit | Attending: Pediatrics | Admitting: Pediatrics

## 2023-01-22 DIAGNOSIS — Z113 Encounter for screening for infections with a predominantly sexual mode of transmission: Secondary | ICD-10-CM | POA: Insufficient documentation

## 2023-04-01 DIAGNOSIS — F410 Panic disorder [episodic paroxysmal anxiety] without agoraphobia: Secondary | ICD-10-CM | POA: Diagnosis not present

## 2023-04-01 DIAGNOSIS — N529 Male erectile dysfunction, unspecified: Secondary | ICD-10-CM | POA: Diagnosis not present

## 2023-04-01 DIAGNOSIS — R911 Solitary pulmonary nodule: Secondary | ICD-10-CM | POA: Diagnosis not present

## 2023-04-01 DIAGNOSIS — Z79899 Other long term (current) drug therapy: Secondary | ICD-10-CM | POA: Diagnosis not present

## 2023-04-01 DIAGNOSIS — I739 Peripheral vascular disease, unspecified: Secondary | ICD-10-CM | POA: Diagnosis not present

## 2023-04-01 DIAGNOSIS — I69959 Hemiplegia and hemiparesis following unspecified cerebrovascular disease affecting unspecified side: Secondary | ICD-10-CM | POA: Diagnosis not present

## 2023-04-01 DIAGNOSIS — F430 Acute stress reaction: Secondary | ICD-10-CM | POA: Diagnosis not present

## 2023-04-01 DIAGNOSIS — I700 Atherosclerosis of aorta: Secondary | ICD-10-CM | POA: Diagnosis not present

## 2023-04-01 DIAGNOSIS — E7800 Pure hypercholesterolemia, unspecified: Secondary | ICD-10-CM | POA: Diagnosis not present

## 2023-04-01 DIAGNOSIS — J439 Emphysema, unspecified: Secondary | ICD-10-CM | POA: Diagnosis not present

## 2023-04-17 DIAGNOSIS — J439 Emphysema, unspecified: Secondary | ICD-10-CM | POA: Diagnosis not present

## 2023-04-17 DIAGNOSIS — F17210 Nicotine dependence, cigarettes, uncomplicated: Secondary | ICD-10-CM | POA: Diagnosis not present

## 2023-04-17 DIAGNOSIS — I700 Atherosclerosis of aorta: Secondary | ICD-10-CM | POA: Diagnosis not present

## 2023-04-17 DIAGNOSIS — R911 Solitary pulmonary nodule: Secondary | ICD-10-CM | POA: Diagnosis not present

## 2023-08-14 DIAGNOSIS — Z0000 Encounter for general adult medical examination without abnormal findings: Secondary | ICD-10-CM | POA: Diagnosis not present

## 2023-08-14 DIAGNOSIS — Z1211 Encounter for screening for malignant neoplasm of colon: Secondary | ICD-10-CM | POA: Diagnosis not present

## 2023-08-14 DIAGNOSIS — Z1331 Encounter for screening for depression: Secondary | ICD-10-CM | POA: Diagnosis not present

## 2023-08-14 DIAGNOSIS — Z9181 History of falling: Secondary | ICD-10-CM | POA: Diagnosis not present

## 2023-08-14 DIAGNOSIS — Z139 Encounter for screening, unspecified: Secondary | ICD-10-CM | POA: Diagnosis not present

## 2023-09-24 ENCOUNTER — Other Ambulatory Visit: Payer: Self-pay

## 2023-09-24 DIAGNOSIS — I739 Peripheral vascular disease, unspecified: Secondary | ICD-10-CM

## 2023-10-02 ENCOUNTER — Ambulatory Visit (HOSPITAL_COMMUNITY)
Admission: RE | Admit: 2023-10-02 | Discharge: 2023-10-02 | Disposition: A | Discharge status: Home / Self Care | Payer: Medicare HMO | Source: Ambulatory Visit | Attending: Vascular Surgery

## 2023-10-02 DIAGNOSIS — I739 Peripheral vascular disease, unspecified: Secondary | ICD-10-CM | POA: Diagnosis not present

## 2023-10-03 LAB — VAS US ABI WITH/WO TBI
Left ABI: 0.73
Right ABI: 0.61

## 2023-10-05 DIAGNOSIS — J439 Emphysema, unspecified: Secondary | ICD-10-CM | POA: Diagnosis not present

## 2023-10-05 DIAGNOSIS — R911 Solitary pulmonary nodule: Secondary | ICD-10-CM | POA: Diagnosis not present

## 2023-10-05 DIAGNOSIS — Z1159 Encounter for screening for other viral diseases: Secondary | ICD-10-CM | POA: Diagnosis not present

## 2023-10-05 DIAGNOSIS — D692 Other nonthrombocytopenic purpura: Secondary | ICD-10-CM | POA: Diagnosis not present

## 2023-10-05 DIAGNOSIS — N529 Male erectile dysfunction, unspecified: Secondary | ICD-10-CM | POA: Diagnosis not present

## 2023-10-05 DIAGNOSIS — Z79899 Other long term (current) drug therapy: Secondary | ICD-10-CM | POA: Diagnosis not present

## 2023-10-05 DIAGNOSIS — F410 Panic disorder [episodic paroxysmal anxiety] without agoraphobia: Secondary | ICD-10-CM | POA: Diagnosis not present

## 2023-10-05 DIAGNOSIS — I69959 Hemiplegia and hemiparesis following unspecified cerebrovascular disease affecting unspecified side: Secondary | ICD-10-CM | POA: Diagnosis not present

## 2023-10-05 DIAGNOSIS — I739 Peripheral vascular disease, unspecified: Secondary | ICD-10-CM | POA: Diagnosis not present

## 2023-10-05 DIAGNOSIS — F430 Acute stress reaction: Secondary | ICD-10-CM | POA: Diagnosis not present

## 2023-10-05 DIAGNOSIS — Z125 Encounter for screening for malignant neoplasm of prostate: Secondary | ICD-10-CM | POA: Diagnosis not present

## 2023-10-05 DIAGNOSIS — E7800 Pure hypercholesterolemia, unspecified: Secondary | ICD-10-CM | POA: Diagnosis not present

## 2023-10-08 ENCOUNTER — Encounter (HOSPITAL_COMMUNITY): Payer: Medicare HMO

## 2023-10-08 ENCOUNTER — Encounter: Payer: Self-pay | Admitting: Physician Assistant

## 2023-10-08 ENCOUNTER — Ambulatory Visit: Payer: Medicare HMO | Admitting: Physician Assistant

## 2023-10-08 VITALS — BP 151/76 | HR 82 | Temp 98.3°F | Resp 22 | Ht 68.0 in | Wt 160.8 lb

## 2023-10-08 DIAGNOSIS — I739 Peripheral vascular disease, unspecified: Secondary | ICD-10-CM

## 2023-10-08 NOTE — Progress Notes (Signed)
 HISTORY AND PHYSICAL     CC:  follow up. Requesting Provider:  Stephanie Charlene CROME, MD  HPI: This is a 73 y.o. male who is here today for follow up for PAD.  Pt has hx of infrainguinal arterial occlusive disease bilaterally.  The right worse than left.  He has hx of mild carotid disease.    Pt was last seen 10/02/2022 and at that time, he was still having some claudication with about 50 yards in the right calf.  He was still smoking about 1ppd and trying to quit.  He did not have any non healing wounds.  He had occasional rest pain relieved with dependency but did not always occur.   The pt returns today for follow up & here with his daughter.  He states that he has gotten a pain in the right foot a couple of times that has woken him up but as before, this does not happen regularly.  He continues to get claudication with about 50 yards.  He states he can walk through it.  He has a callus on the back of his heel due to his shoes rubbing.  He quit wearing them and they are getting better.  He continues to smoke.  He has tried a few times to quit smoking.   The pt is on a statin although not listed.  He is not on asa due to bleeding issues.   The pt is on a statin for cholesterol management.    The pt is not on an aspirin .    Other AC:  none The pt is not on medication for hypertension.  The pt is not on medication for diabetes. Tobacco hx:  current  Pt does not have family hx of AAA.  Past Medical History:  Diagnosis Date   COPD (chronic obstructive pulmonary disease) (HCC)    DDD (degenerative disc disease), lumbar    Stroke [facility])     Past Surgical History:  Procedure Laterality Date   BACK SURGERY  2009   lower    No Known Allergies  Current Outpatient Medications  Medication Sig Dispense Refill   albuterol  (PROVENTIL ) (2.5 MG/3ML) 0.083% nebulizer solution SMARTSIG:1 Vial(s) Via Nebulizer Every 4-6 Hours PRN     albuterol  (VENTOLIN  HFA) 108 (90 Base) MCG/ACT inhaler SMARTSIG:2  Puff(s) By Mouth Every 4-6 Hours PRN     ANORO ELLIPTA 62.5-25 MCG/INH AEPB INHALE 1 PUFF DAILY     escitalopram (LEXAPRO) 20 MG tablet Take 1 tablet by mouth daily.     pramipexole (MIRAPEX) 1 MG tablet Take 1 mg by mouth at bedtime.     zolpidem (AMBIEN) 10 MG tablet Take 10 mg by mouth at bedtime as needed. for sleep  5   No current facility-administered medications for this visit.    Family History  Problem Relation Age of Onset   Stroke Mother    Heart disease Father     Social History   Socioeconomic History   Marital status: Single    Spouse name: Not on file   Number of children: 3   Years of education: 21   Highest education level: Not on file  Occupational History    Comment: retired  Tobacco Use   Smoking status: Every Day    Current packs/day: 1.00    Types: Cigarettes   Smokeless tobacco: Never   Tobacco comments:    12/06/15 < 1 PPD  Vaping Use   Vaping status: Never Used  Substance and Sexual Activity  Alcohol use: Yes    Comment: 3 beers a day   Drug use: No    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   Sexual activity: Not on file  Other Topics Concern   Not on file  Social History Narrative   Lives alone   Caffeine- coffee 2 cups daily   Social Drivers of Health   Financial Resource Strain: Not on file  Food Insecurity: Not on file  Transportation Needs: Not on file  Physical Activity: Not on file  Stress: Not on file  Social Connections: Not on file  Intimate Partner Violence: Not on file     REVIEW OF SYSTEMS:   [X]  denotes positive finding, [ ]  denotes negative finding Cardiac  Comments:  Chest pain or chest pressure:    Shortness of breath upon exertion:    Short of breath when lying flat:    Irregular heart rhythm:        Vascular    Pain in calf, thigh, or hip brought on by ambulation: x   Pain in feet at night that wakes you up from your sleep:  x   Blood clot in your veins:    Leg swelling:         Pulmonary    Oxygen  at  home:    Productive cough:     Wheezing:         Neurologic    Sudden weakness in arms or legs:     Sudden numbness in arms or legs:     Sudden onset of difficulty speaking or slurred speech:    Temporary loss of vision in one eye:     Problems with dizziness:         Gastrointestinal    Blood in stool:     Vomited blood:         Genitourinary    Burning when urinating:     Blood in urine:        Psychiatric    Major depression:         Hematologic    Bleeding problems:    Problems with blood clotting too easily:        Skin    Rashes or ulcers:        Constitutional    Fever or chills:      PHYSICAL EXAMINATION:  Today's Vitals   10/08/23 1232 10/08/23 1234  BP: (!) 151/76   Pulse: 82   Resp: (!) 22   Temp: 98.3 F (36.8 C)   SpO2: 94%   Weight: 160 lb 12.8 oz (72.9 kg)   Height: 5' 8 (1.727 m)   PainSc: 0-No pain 0-No pain   Body mass index is 24.45 kg/m.   General:  WDWN in NAD; vital signs documented above Gait: Not observed HENT: WNL, normocephalic Pulmonary: normal non-labored breathing , without wheezing Cardiac: regular HR, without carotid bruits Abdomen: soft, NT; aortic pulse is not palpable Skin: without rashes Vascular Exam/Pulses:  Right Left  Radial 2+ (normal) 2+ (normal)  AT Brisk biphasic Brisk biphasic  PT monophasic monophasic  Peroneal Faint monophasic Faint monophasic   Extremities: without ischemic changes, without Gangrene , without cellulitis; without open wounds Musculoskeletal: no muscle wasting or atrophy  Neurologic: A&O X 3 Psychiatric:  The pt has Normal affect.   Non-Invasive Vascular Imaging:   ABI's/TBI's on 10/03/2023: Right:  0.61/0.53 - Great toe pressure: 85 Left:  0.73/0.73 - Great toe pressure: 117   Previous ABI's/TBI's on 10/02/2022: Right:  0.54/0.19 - Great toe pressure: 32 Left:  0.71/0.14 - Great toe pressure:  24  Previous carotid duplex on 10/02/2022: Right:  1-39% ICA stenosis Left:  nearly  normal    ASSESSMENT/PLAN:: 73 y.o. male here for follow up for PAD with hx of claudication   -pt doing well and symptoms are stable.  Encouraged him to continue walking as it builds up collateral flow.  His ABI's are stable from a couple of visits ago.  He does have a callus on the achille area of both feet that is improving.   -encouraged him to protect his feet and inspect them daily.  He knows to call if he develops constant rest pain or non healing wounds.   -continue statin -pt will f/u in one year with ABI.    Current smoker -discussed the importance of smoking cessation and puts him at risk for limb loss, heart attack, stroke, cancers.  We discussed [id number].  He will work on this.   Lucie Apt, [facility] [phone]  Clinic MD:   [place]

## 2023-10-22 DIAGNOSIS — M79641 Pain in right hand: Secondary | ICD-10-CM | POA: Diagnosis not present

## 2023-10-22 DIAGNOSIS — M25531 Pain in right wrist: Secondary | ICD-10-CM | POA: Diagnosis not present

## 2024-02-09 DIAGNOSIS — L03114 Cellulitis of left upper limb: Secondary | ICD-10-CM | POA: Diagnosis not present

## 2024-02-09 DIAGNOSIS — R030 Elevated blood-pressure reading, without diagnosis of hypertension: Secondary | ICD-10-CM | POA: Diagnosis not present

## 2024-02-09 DIAGNOSIS — S61451A Open bite of right hand, initial encounter: Secondary | ICD-10-CM | POA: Diagnosis not present

## 2024-02-09 DIAGNOSIS — E7800 Pure hypercholesterolemia, unspecified: Secondary | ICD-10-CM | POA: Diagnosis not present

## 2024-02-09 DIAGNOSIS — W5981XA Bitten by other nonvenomous reptiles, initial encounter: Secondary | ICD-10-CM | POA: Diagnosis not present

## 2024-02-09 DIAGNOSIS — R2243 Localized swelling, mass and lump, lower limb, bilateral: Secondary | ICD-10-CM | POA: Diagnosis not present

## 2024-02-09 DIAGNOSIS — F419 Anxiety disorder, unspecified: Secondary | ICD-10-CM | POA: Diagnosis not present

## 2024-02-09 DIAGNOSIS — J449 Chronic obstructive pulmonary disease, unspecified: Secondary | ICD-10-CM | POA: Diagnosis not present

## 2024-02-09 DIAGNOSIS — F32A Depression, unspecified: Secondary | ICD-10-CM | POA: Diagnosis not present

## 2024-02-09 DIAGNOSIS — F17210 Nicotine dependence, cigarettes, uncomplicated: Secondary | ICD-10-CM | POA: Diagnosis not present

## 2024-02-09 DIAGNOSIS — S61452A Open bite of left hand, initial encounter: Secondary | ICD-10-CM | POA: Diagnosis not present

## 2024-02-09 DIAGNOSIS — M720 Palmar fascial fibromatosis [Dupuytren]: Secondary | ICD-10-CM | POA: Diagnosis not present

## 2024-02-09 DIAGNOSIS — L03113 Cellulitis of right upper limb: Secondary | ICD-10-CM | POA: Diagnosis not present

## 2024-02-09 DIAGNOSIS — S60512A Abrasion of left hand, initial encounter: Secondary | ICD-10-CM | POA: Diagnosis not present

## 2024-02-09 DIAGNOSIS — L03012 Cellulitis of left finger: Secondary | ICD-10-CM | POA: Diagnosis not present

## 2024-02-10 DIAGNOSIS — S61452A Open bite of left hand, initial encounter: Secondary | ICD-10-CM | POA: Diagnosis not present

## 2024-02-10 DIAGNOSIS — R030 Elevated blood-pressure reading, without diagnosis of hypertension: Secondary | ICD-10-CM | POA: Diagnosis not present

## 2024-02-10 DIAGNOSIS — L03114 Cellulitis of left upper limb: Secondary | ICD-10-CM | POA: Diagnosis not present

## 2024-02-11 DIAGNOSIS — L03114 Cellulitis of left upper limb: Secondary | ICD-10-CM | POA: Diagnosis not present

## 2024-02-11 DIAGNOSIS — S61452A Open bite of left hand, initial encounter: Secondary | ICD-10-CM | POA: Diagnosis not present

## 2024-02-11 DIAGNOSIS — R030 Elevated blood-pressure reading, without diagnosis of hypertension: Secondary | ICD-10-CM | POA: Diagnosis not present

## 2024-02-18 DIAGNOSIS — G4762 Sleep related leg cramps: Secondary | ICD-10-CM | POA: Diagnosis not present

## 2024-02-18 DIAGNOSIS — I10 Essential (primary) hypertension: Secondary | ICD-10-CM | POA: Diagnosis not present

## 2024-02-18 DIAGNOSIS — L03114 Cellulitis of left upper limb: Secondary | ICD-10-CM | POA: Diagnosis not present

## 2024-02-18 DIAGNOSIS — Z79899 Other long term (current) drug therapy: Secondary | ICD-10-CM | POA: Diagnosis not present

## 2024-02-18 DIAGNOSIS — W5981XD Bitten by other nonvenomous reptiles, subsequent encounter: Secondary | ICD-10-CM | POA: Diagnosis not present

## 2024-04-06 DIAGNOSIS — I739 Peripheral vascular disease, unspecified: Secondary | ICD-10-CM | POA: Diagnosis not present

## 2024-04-06 DIAGNOSIS — F430 Acute stress reaction: Secondary | ICD-10-CM | POA: Diagnosis not present

## 2024-04-06 DIAGNOSIS — J439 Emphysema, unspecified: Secondary | ICD-10-CM | POA: Diagnosis not present

## 2024-04-06 DIAGNOSIS — I69959 Hemiplegia and hemiparesis following unspecified cerebrovascular disease affecting unspecified side: Secondary | ICD-10-CM | POA: Diagnosis not present

## 2024-04-06 DIAGNOSIS — I10 Essential (primary) hypertension: Secondary | ICD-10-CM | POA: Diagnosis not present

## 2024-04-06 DIAGNOSIS — E7800 Pure hypercholesterolemia, unspecified: Secondary | ICD-10-CM | POA: Diagnosis not present

## 2024-04-06 DIAGNOSIS — N529 Male erectile dysfunction, unspecified: Secondary | ICD-10-CM | POA: Diagnosis not present

## 2024-04-06 DIAGNOSIS — Z79899 Other long term (current) drug therapy: Secondary | ICD-10-CM | POA: Diagnosis not present

## 2024-04-06 DIAGNOSIS — F410 Panic disorder [episodic paroxysmal anxiety] without agoraphobia: Secondary | ICD-10-CM | POA: Diagnosis not present

## 2024-08-04 DIAGNOSIS — F17210 Nicotine dependence, cigarettes, uncomplicated: Secondary | ICD-10-CM | POA: Diagnosis not present

## 2024-08-04 DIAGNOSIS — Z122 Encounter for screening for malignant neoplasm of respiratory organs: Secondary | ICD-10-CM | POA: Diagnosis not present

## 2024-08-04 DIAGNOSIS — R918 Other nonspecific abnormal finding of lung field: Secondary | ICD-10-CM | POA: Diagnosis not present
